# Patient Record
Sex: Female | Born: 1970
Health system: Southern US, Community
[De-identification: ages and names within clinical notes are randomized; demographics above are authoritative.]

## PROBLEM LIST (undated history)

## (undated) DIAGNOSIS — I10 Essential (primary) hypertension: Secondary | ICD-10-CM

## (undated) DIAGNOSIS — R7303 Prediabetes: Secondary | ICD-10-CM

## (undated) DIAGNOSIS — E785 Hyperlipidemia, unspecified: Secondary | ICD-10-CM

## (undated) DIAGNOSIS — M109 Gout, unspecified: Secondary | ICD-10-CM

## (undated) HISTORY — DX: Hyperlipidemia, unspecified: E78.5

## (undated) HISTORY — PX: BREAST SURGERY: SHX581

## (undated) HISTORY — DX: Prediabetes: R73.03

## (undated) HISTORY — DX: Essential (primary) hypertension: I10

## (undated) HISTORY — DX: Gout, unspecified: M10.9

---

## 1993-06-21 DIAGNOSIS — D649 Anemia, unspecified: Secondary | ICD-10-CM

## 1993-06-21 HISTORY — DX: Anemia, unspecified: D64.9

## 1998-05-27 ENCOUNTER — Encounter: Admission: RE | Admit: 1998-05-27 | Discharge: 1998-05-27 | Payer: Self-pay | Admitting: Internal Medicine

## 1998-10-09 ENCOUNTER — Encounter: Admission: RE | Admit: 1998-10-09 | Discharge: 1998-10-09 | Payer: Self-pay | Admitting: Hematology and Oncology

## 2004-10-14 ENCOUNTER — Other Ambulatory Visit: Admission: RE | Admit: 2004-10-14 | Discharge: 2004-10-14 | Payer: Self-pay | Admitting: Family Medicine

## 2005-02-15 ENCOUNTER — Emergency Department (HOSPITAL_COMMUNITY): Admission: EM | Admit: 2005-02-15 | Discharge: 2005-02-15 | Payer: Self-pay | Admitting: *Deleted

## 2006-07-08 ENCOUNTER — Other Ambulatory Visit: Admission: RE | Admit: 2006-07-08 | Discharge: 2006-07-08 | Payer: Self-pay | Admitting: Family Medicine

## 2009-06-13 ENCOUNTER — Emergency Department (HOSPITAL_COMMUNITY): Admission: EM | Admit: 2009-06-13 | Discharge: 2009-06-13 | Payer: Self-pay | Admitting: Emergency Medicine

## 2009-10-10 ENCOUNTER — Encounter: Admission: RE | Admit: 2009-10-10 | Discharge: 2009-10-10 | Payer: Self-pay | Admitting: Family Medicine

## 2010-10-26 ENCOUNTER — Other Ambulatory Visit: Payer: Self-pay | Admitting: Family Medicine

## 2010-10-26 ENCOUNTER — Other Ambulatory Visit (HOSPITAL_COMMUNITY)
Admission: RE | Admit: 2010-10-26 | Discharge: 2010-10-26 | Disposition: A | Discharge status: Home / Self Care | Payer: Managed Care, Other (non HMO) | Source: Ambulatory Visit | Attending: Family Medicine | Admitting: Family Medicine

## 2010-10-26 DIAGNOSIS — Z01419 Encounter for gynecological examination (general) (routine) without abnormal findings: Secondary | ICD-10-CM | POA: Insufficient documentation

## 2011-11-12 ENCOUNTER — Encounter (INDEPENDENT_AMBULATORY_CARE_PROVIDER_SITE_OTHER): Payer: Self-pay | Admitting: General Surgery

## 2011-11-16 ENCOUNTER — Encounter (INDEPENDENT_AMBULATORY_CARE_PROVIDER_SITE_OTHER): Payer: Self-pay | Admitting: General Surgery

## 2012-04-08 IMAGING — US US TRANSVAGINAL NON-OB
1 series · 13 of 25 positions shown · non-contrast
Comparison: None.

CLINICAL DATA: Menorrhagia and dysmenorrhea.  Question fibroids.
LMP 10/03/2009

TRANSABDOMINAL AND TRANSVAGINAL ULTRASOUND OF PELVIS
TECHNIQUE: Both transabdominal and transvaginal ultrasound
examinations of the pelvis were performed including evaluation of
the uterus, ovaries, adnexal regions, and pelvic cul-de-sac.

[Series 1: us transvaginal non-ob · 0.28mm/px · 13 of 54 slices shown]
[im 1/54]
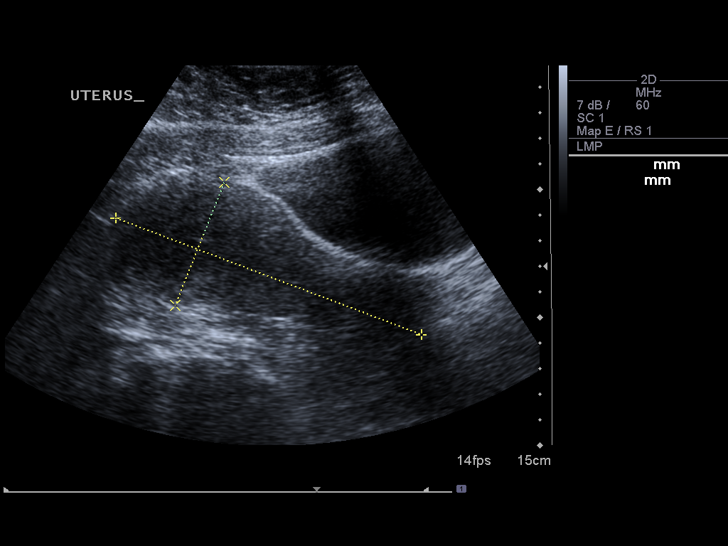
[im 5/54]
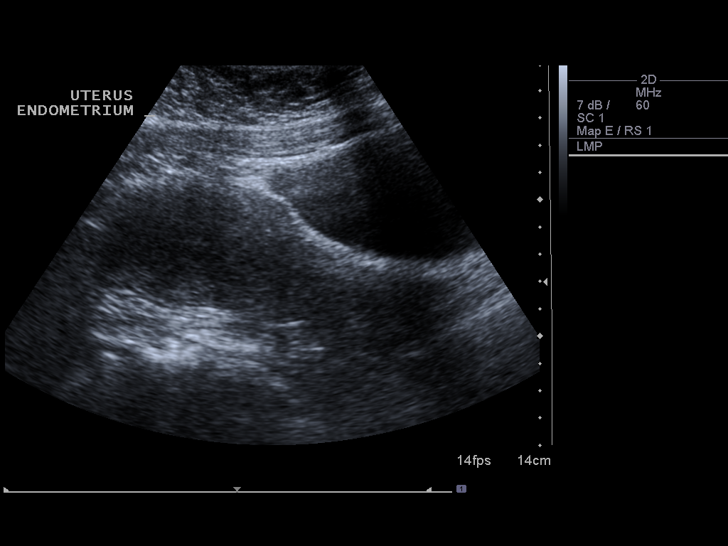
[im 9/54]
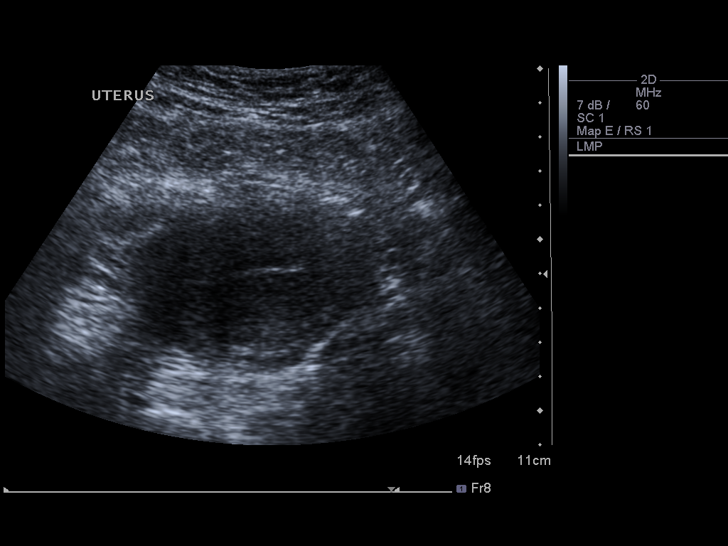
[im 14/54]
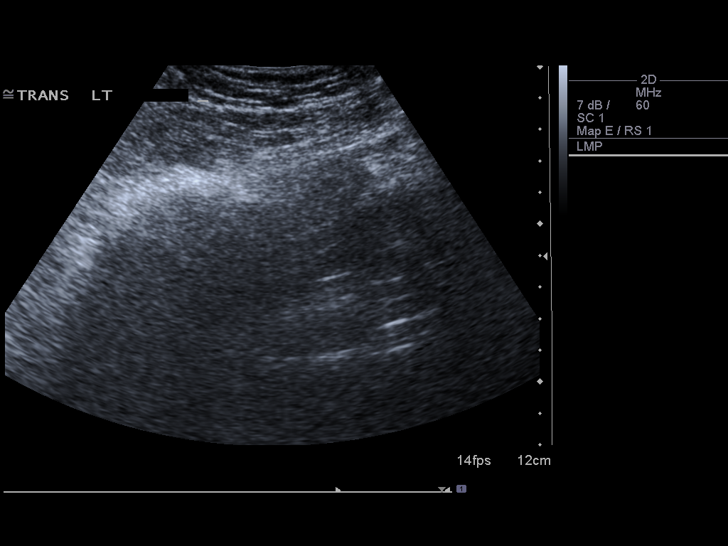
[im 18/54]
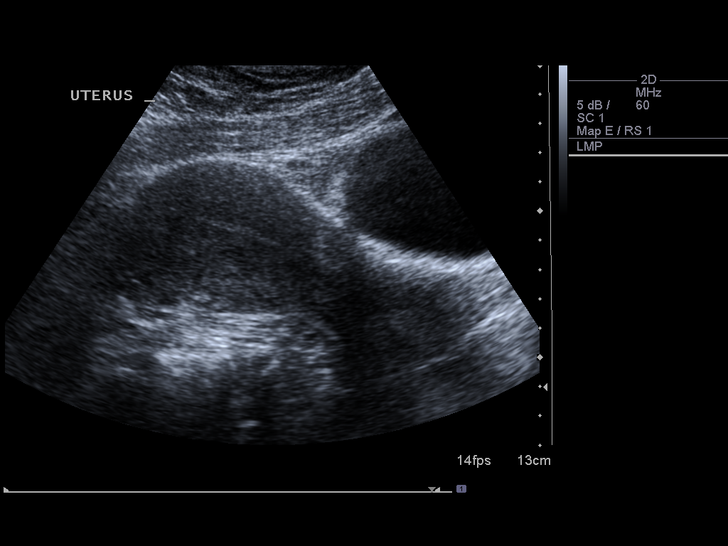
[im 23/54]
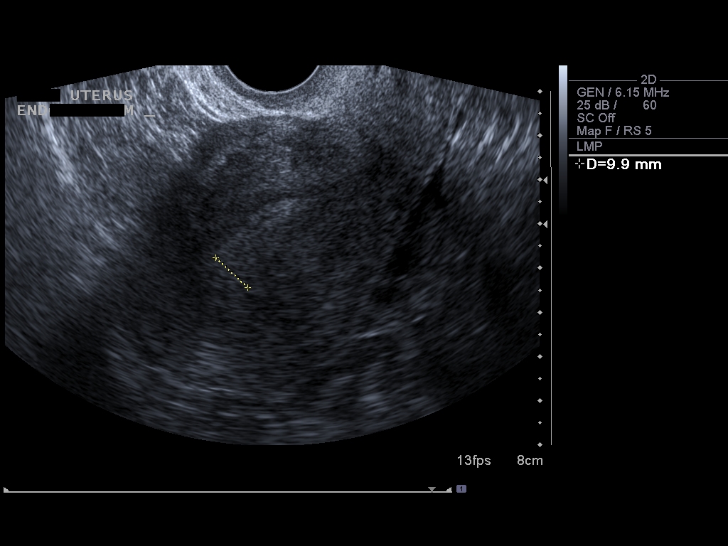
[im 27/54]
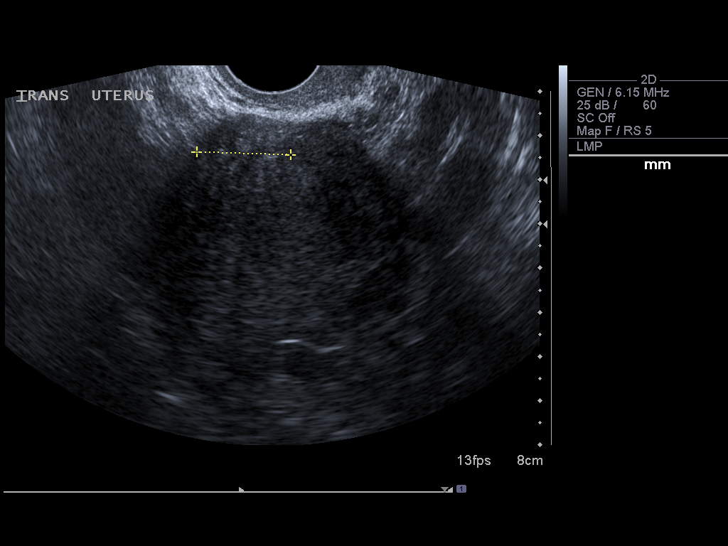
[im 31/54]
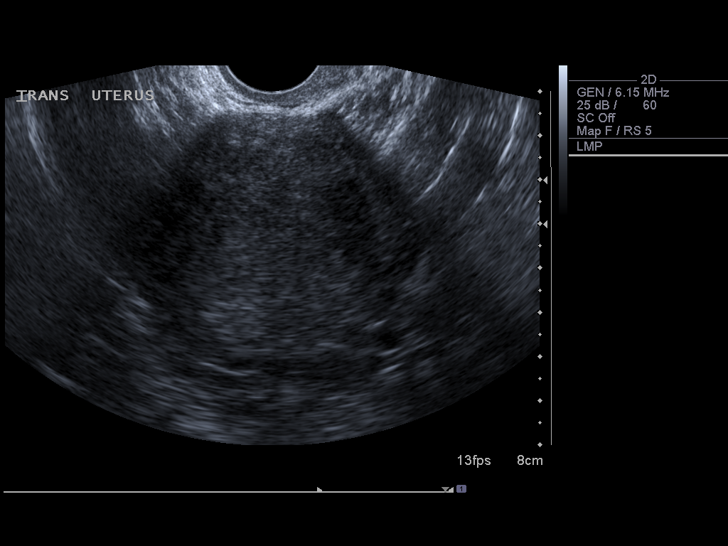
[im 36/54]
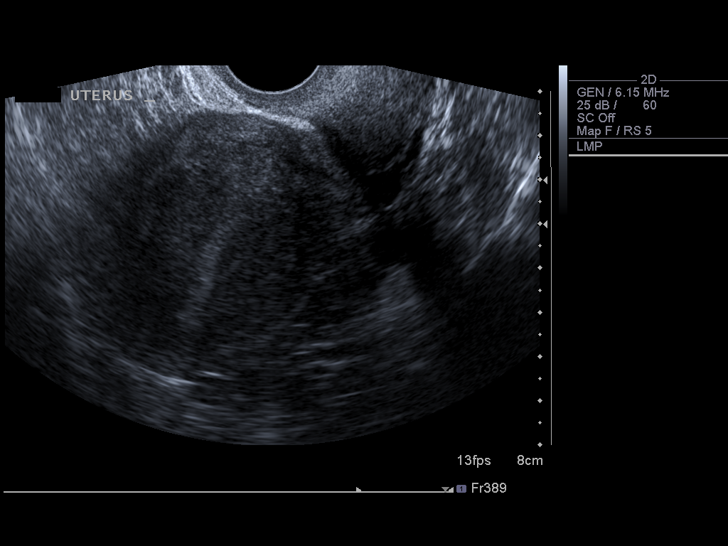
[im 40/54]
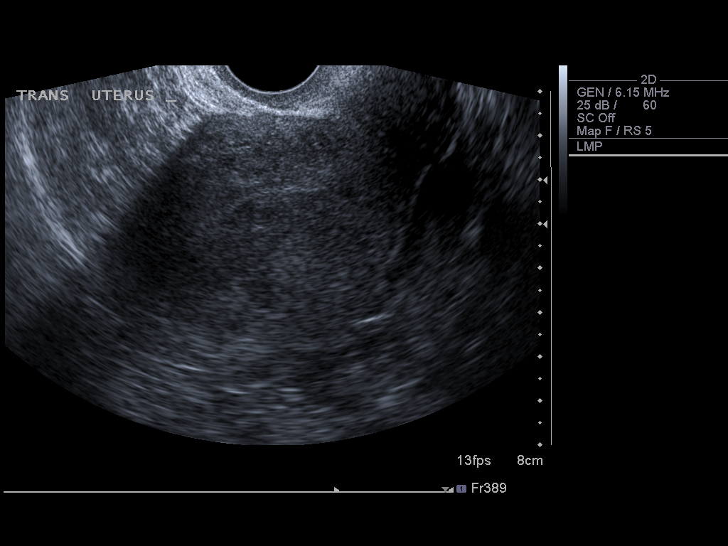
[im 45/54]
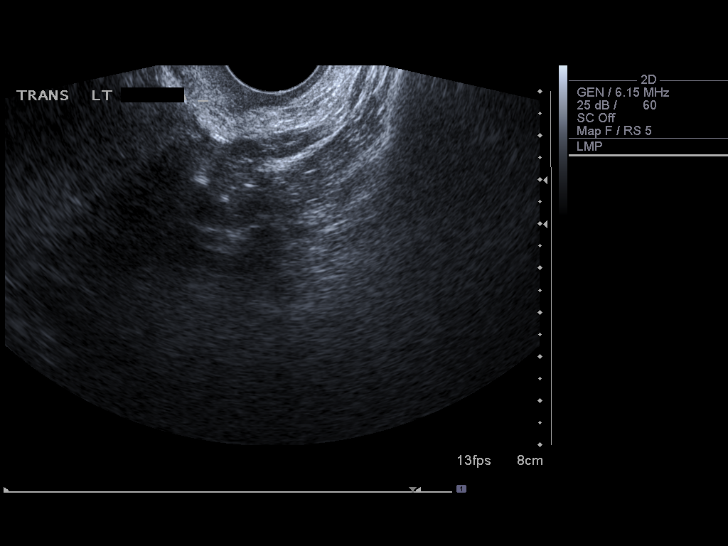
[im 49/54]
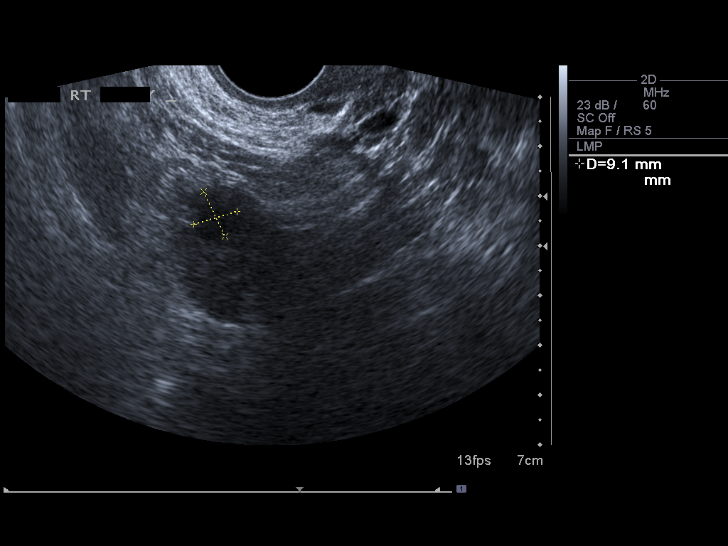
[im 54/54]
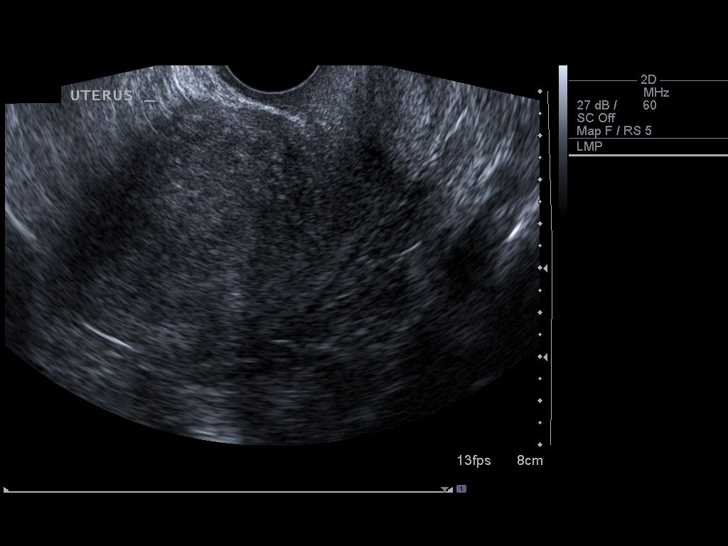

[13 of 25 positions shown; findings below may reference images not displayed]

FINDINGS: Uterus the uterus demonstrates a sagittal length of 12.2 cm, an AP
width of 5.2 cm and a transverse width of 5.7 cm.  The myometrium
appears inhomogeneous in nature with some posterior acoustical
shadowing from the anterior myometrium.  Two areas of focally
altered echotexture are seen compatible with small focal fibroids,
one which is mural in the left lateral upper uterine segment
measuring 1.2 x 1.3 x 1.0 cm and in the posterior midbody measuring
1.7 x 1.6 x 1.9 cm and with a questionable partial submucosal
component.  Overall scan resolution endovaginally is compromised by
the anterior-posterior positioning of the uterus.

Endometrium is homogeneously echogenic and demonstrates an AP width
of 5.3 mm this correlates with a postsecretory endometrial stripe
and the patient's given LMP of 10/03/2009.  No definite areas of
focal thickening or inhomogeneity are noted.

Right Ovary measures 2.8 x 2.2 x 2.0 cm and has a normal appearance

Left Ovary is not seen either transabdominally or endovaginally.

Other Findings:  No pelvic fluid is noted.
IMPRESSION: Inhomogeneous uterine myometrium especially notable anteriorly.
This finding raises the possibility of underlying adenomyosis.  Two
small measurable focal fibroids one of which may have a small
submucosal component.

No definite focal endometrial abnormality.  Normal right ovary and
non-visualized left ovary.

If clinical concern warrants, further assessment for the presence
of adenomyosis and better delineation of focal fibroids and the
relationship with the adjacent myometrium can be obtained width
MRI.  Sono hysterography may be less helpful given the positioning
of the uterus on endovaginal exam.

## 2012-12-20 ENCOUNTER — Emergency Department: Payer: Self-pay | Admitting: Emergency Medicine

## 2013-07-23 ENCOUNTER — Other Ambulatory Visit: Payer: Self-pay | Admitting: Family Medicine

## 2013-07-23 ENCOUNTER — Other Ambulatory Visit (HOSPITAL_COMMUNITY)
Admission: RE | Admit: 2013-07-23 | Discharge: 2013-07-23 | Disposition: A | Discharge status: Home / Self Care | Payer: Managed Care, Other (non HMO) | Source: Ambulatory Visit | Attending: Family Medicine | Admitting: Family Medicine

## 2013-07-23 DIAGNOSIS — Z1151 Encounter for screening for human papillomavirus (HPV): Secondary | ICD-10-CM | POA: Insufficient documentation

## 2013-07-23 DIAGNOSIS — Z01419 Encounter for gynecological examination (general) (routine) without abnormal findings: Secondary | ICD-10-CM | POA: Insufficient documentation

## 2019-09-22 ENCOUNTER — Ambulatory Visit: Payer: Managed Care, Other (non HMO) | Attending: Internal Medicine

## 2019-09-22 DIAGNOSIS — Z23 Encounter for immunization: Secondary | ICD-10-CM

## 2019-09-22 NOTE — Progress Notes (Signed)
   Covid-19 Vaccination Clinic  Name:  Jessica Schaefer    MRN: [id number] DOB: Feb 05, 1971  09/22/2019  Jessica Schaefer was observed post Covid-19 immunization for 15 minutes without incident. She was provided with Vaccine Information Sheet and instruction to access the V-Safe system.   Jessica Schaefer was instructed to call 911 with any severe reactions post vaccine: Marland Kitchen Difficulty breathing  . Swelling of face and throat  . A fast heartbeat  . A bad rash all over body  . Dizziness and weakness   Immunizations Administered    Name Date Dose VIS Date Route   Pfizer COVID-19 Vaccine 09/22/2019 11:22 AM 0.3 mL 06/01/2019 Intramuscular   Manufacturer: [employer], [employer]   Lot: [id number]   NDC: [address]-2

## 2019-10-16 ENCOUNTER — Ambulatory Visit: Payer: Managed Care, Other (non HMO)

## 2019-10-16 ENCOUNTER — Ambulatory Visit: Payer: Managed Care, Other (non HMO) | Attending: Internal Medicine

## 2019-10-16 DIAGNOSIS — Z23 Encounter for immunization: Secondary | ICD-10-CM

## 2019-10-16 NOTE — Progress Notes (Signed)
   Covid-19 Vaccination Clinic  Name:  YURIKA PEREDA    MRN: [id number] DOB: [birth year]  10/16/2019  Ms. Peine was observed post Covid-19 immunization for 15 minutes without incident. She was provided with Vaccine Information Sheet and instruction to access the V-Safe system.   Ms. Burson was instructed to call 911 with any severe reactions post vaccine: Marland Kitchen Difficulty breathing  . Swelling of face and throat  . A fast heartbeat  . A bad rash all over body  . Dizziness and weakness   Immunizations Administered    Name Date Dose VIS Date Route   Pfizer COVID-19 Vaccine 10/16/2019 12:36 PM 0.3 mL 08/15/2018 Intramuscular   Manufacturer: [employer], [employer]   Lot: [id number]   NDC: [address]-4

## 2020-03-21 ENCOUNTER — Ambulatory Visit (INDEPENDENT_AMBULATORY_CARE_PROVIDER_SITE_OTHER): Payer: Managed Care, Other (non HMO) | Admitting: Family Medicine

## 2020-03-21 ENCOUNTER — Other Ambulatory Visit: Payer: Self-pay

## 2020-03-21 ENCOUNTER — Other Ambulatory Visit: Payer: Self-pay | Admitting: Family Medicine

## 2020-03-21 ENCOUNTER — Encounter: Payer: Self-pay | Admitting: Family Medicine

## 2020-03-21 DIAGNOSIS — M109 Gout, unspecified: Secondary | ICD-10-CM

## 2020-03-21 DIAGNOSIS — Z09 Encounter for follow-up examination after completed treatment for conditions other than malignant neoplasm: Secondary | ICD-10-CM

## 2020-03-21 DIAGNOSIS — R8290 Unspecified abnormal findings in urine: Secondary | ICD-10-CM

## 2020-03-21 DIAGNOSIS — Z7689 Persons encountering health services in other specified circumstances: Secondary | ICD-10-CM

## 2020-03-21 DIAGNOSIS — R634 Abnormal weight loss: Secondary | ICD-10-CM

## 2020-03-21 DIAGNOSIS — Z23 Encounter for immunization: Secondary | ICD-10-CM | POA: Diagnosis not present

## 2020-03-21 DIAGNOSIS — I10 Essential (primary) hypertension: Secondary | ICD-10-CM | POA: Diagnosis not present

## 2020-03-21 DIAGNOSIS — E559 Vitamin D deficiency, unspecified: Secondary | ICD-10-CM

## 2020-03-21 HISTORY — DX: Vitamin D deficiency, unspecified: E55.9

## 2020-03-21 MED ORDER — COLCHICINE 0.6 MG PO TABS: ORAL_TABLET | ORAL | 3 refills | Status: DC

## 2020-03-21 MED ORDER — ALLOPURINOL 100 MG PO TABS: 100.0000 mg | ORAL_TABLET | Freq: Every day | ORAL | 3 refills | Status: DC

## 2020-03-21 MED ORDER — TETANUS-DIPHTHERIA TOXOIDS TD 5-2 LFU IM INJ: 0.5000 mL | INJECTION | Freq: Once | INTRAMUSCULAR | Status: DC

## 2020-03-21 MED ORDER — METFORMIN HCL 500 MG PO TABS
500.0000 mg | ORAL_TABLET | Freq: Two times a day (BID) | ORAL | 1 refills | Status: DC
Start: 2020-03-21 — End: 2020-09-24

## 2020-03-21 MED ORDER — HYDROCHLOROTHIAZIDE 25 MG PO TABS: 25.0000 mg | ORAL_TABLET | Freq: Every day | ORAL | 3 refills | Status: DC

## 2020-03-21 MED ORDER — LISINOPRIL 20 MG PO TABS: 20.0000 mg | ORAL_TABLET | Freq: Every day | ORAL | 3 refills | Status: DC

## 2020-03-21 NOTE — Progress Notes (Signed)
[facility]   New Patient--Establish Care  Subjective:  Patient ID: Jessica Schaefer, female    DOB: [birth year]  Age: 49 y.o. MRN: [id number]  CC:  Chief Complaint  Patient presents with  . Establish Care    Pt states she wants to get a check up. Pt states she have not seen a PCP since covid-19 started. Pt is also concerned about her weight gain.  . Knee Pain    Pt states when she walks it fills like her L knee is cracking off and on. X2 months    HPI Jessica Schaefer is a 49 year old female who presents to Establish Care today.    There are no problems to display for this patient.   Current Status: This will be Jessica Schaefer's initial office visit with me. She was previously seeing Dr. Wynelle Link for her PCP needs. Since her last office visit, she is doing well with no complaints. She has c/o of chronic bilateral knee pain. She currently works at home. She is also concerned about her recent weight gain. She has never been screened breast and colon cancer. She denies fevers, chills, fatigue, recent infections, weight loss, and night sweats. She has not had any headaches, visual changes, dizziness, and falls. No chest pain, heart palpitations, cough and shortness of breath reported. No reports of GI problems such as nausea, vomiting, diarrhea, and constipation. She has no reports of blood in stools, dysuria and hematuria. No depression or anxiety reported today. She is taking all medications as prescribed. She denies pain today.   Past Medical History:  Diagnosis Date  . Anemia 1995  . Gout   . Hypertension     Past Surgical History:  Procedure Laterality Date  . BREAST SURGERY     REDUCTION; 1997-1998    Family History  Problem Relation Age of Onset  . Early death Mother   . Hypertension Mother   . Obesity Mother     Social History   Socioeconomic History  . Marital status: Single    Spouse name: Not on file  . Number of  children: Not on file  . Years of education: Not on file  . Highest education level: Not on file  Occupational History  . Not on file  Tobacco Use  . Smoking status: Never Smoker  . Smokeless tobacco: Never Used  Vaping Use  . Vaping Use: Never assessed  Substance and Sexual Activity  . Alcohol use: No  . Drug use: No  . Sexual activity: Not Currently  Other Topics Concern  . Not on file  Social History Narrative  . Not on file   Social Determinants of Health   Financial Resource Strain:   . Difficulty of Paying Living Expenses: Not on file  Food Insecurity:   . Worried About Programme researcher, broadcasting/film/video in the Last Year: Not on file  . Ran Out of Food in the Last Year: Not on file  Transportation Needs:   . Lack of Transportation (Medical): Not on file  . Lack of Transportation (Non-Medical): Not on file  Physical Activity:   . Days of Exercise per Week: Not on file  . Minutes of Exercise per Session: Not on file  Stress:   . Feeling of Stress : Not on file  Social Connections:   . Frequency of Communication with Friends and Family: Not on file  . Frequency of Social Gatherings with Friends and Family: Not on  file  . Attends Religious Services: Not on file  . Active Member of Clubs or Organizations: Not on file  . Attends Banker Meetings: Not on file  . Marital Status: Not on file  Intimate Partner Violence:   . Fear of Current or Ex-Partner: Not on file  . Emotionally Abused: Not on file  . Physically Abused: Not on file  . Sexually Abused: Not on file    Outpatient Medications Prior to Visit  Medication Sig Dispense Refill  . hydrochlorothiazide (HYDRODIURIL) 25 MG tablet Take 25 mg by mouth daily.    . fluticasone (FLONASE) 50 MCG/ACT nasal spray Place 2 sprays into the nose daily. (Patient not taking: Reported on 03/21/2020)    . medroxyPROGESTERone (DEPO-PROVERA) 150 MG/ML injection Inject 150 mg into the muscle every 3 (three) months. (Patient not  taking: Reported on 03/21/2020)     No facility-administered medications prior to visit.    No Known Allergies  ROS Review of Systems  Constitutional: Negative.   HENT: Negative.   Eyes: Negative.   Respiratory: Negative.   Cardiovascular: Negative.   Gastrointestinal: Positive for abdominal distention (obese).  Endocrine: Negative.   Genitourinary: Negative.   Musculoskeletal: Positive for arthralgias (generalized joint pain).  Skin: Negative.   Allergic/Immunologic: Negative.   Neurological: Positive for dizziness (occasional ) and headaches (occasional ).  Hematological: Negative.   Psychiatric/Behavioral: Negative.       Objective:    Physical Exam Vitals and nursing note reviewed.  Constitutional:      Appearance: Normal appearance.  HENT:     Head: Normocephalic and atraumatic.     Nose: Nose normal.     Mouth/Throat:     Mouth: Mucous membranes are moist.     Pharynx: Oropharynx is clear.  Cardiovascular:     Rate and Rhythm: Normal rate and regular rhythm.     Pulses: Normal pulses.     Heart sounds: Normal heart sounds.  Pulmonary:     Effort: Pulmonary effort is normal.     Breath sounds: Normal breath sounds.  Abdominal:     General: Bowel sounds are normal. There is distension (obese).     Palpations: Abdomen is soft.  Musculoskeletal:        General: Normal range of motion.     Cervical back: Normal range of motion and neck supple.  Skin:    General: Skin is warm and dry.  Neurological:     General: No focal deficit present.     Mental Status: She is alert and oriented to person, place, and time.  Psychiatric:        Mood and Affect: Mood normal.        Behavior: Behavior normal.        Thought Content: Thought content normal.        Judgment: Judgment normal.     There were no vitals taken for this visit. Wt Readings from Last 3 Encounters:  No data found for Wt     Health Maintenance Due  Topic Date Due  . Hepatitis C Screening   Never done  . HIV Screening  Never done  . PAP SMEAR-Modifier  07/23/2016  . MAMMOGRAM  Never done  . COLONOSCOPY  Never done  . INFLUENZA VACCINE  Never done    There are no preventive care reminders to display for this patient.  Lab Results  Component Value Date   TSH 1.380 03/21/2020   Lab Results  Component Value Date  WBC 10.6 03/21/2020   HGB 13.2 03/21/2020   HCT 40.5 03/21/2020   MCV 87 03/21/2020   PLT 397 03/21/2020   Lab Results  Component Value Date   NA 143 03/21/2020   K 4.5 03/21/2020   CO2 19 (L) 03/21/2020   GLUCOSE 105 (H) 03/21/2020   BUN 12 03/21/2020   CREATININE 0.91 03/21/2020   BILITOT 0.2 03/21/2020   ALKPHOS 133 (H) 03/21/2020   AST 16 03/21/2020   ALT 20 03/21/2020   PROT 7.6 03/21/2020   ALBUMIN 4.1 03/21/2020   CALCIUM 9.5 03/21/2020   Lab Results  Component Value Date   CHOL 201 (H) 03/21/2020   Lab Results  Component Value Date   HDL 46 03/21/2020   Lab Results  Component Value Date   LDLCALC 131 (H) 03/21/2020   Lab Results  Component Value Date   TRIG 134 03/21/2020   Lab Results  Component Value Date   CHOLHDL 4.4 03/21/2020   No results found for: HGBA1C    Assessment & Plan:   1. Encounter to establish care - Urinalysis Dipstick - POC Glucose (CBG) - POC HgB A1c - Tdap vaccine greater than or equal to 7yo IM  2. Hypertension, unspecified type She will continue to take medications as prescribed, to decrease high sodium intake, excessive alcohol intake, increase potassium intake, smoking cessation, and increase physical activity of at least 30 minutes of cardio activity daily. She will continue to follow Heart Healthy or DASH diet - hydrochlorothiazide (HYDRODIURIL) 25 MG tablet; Take 1 tablet (25 mg total) by mouth daily.  Dispense: 90 tablet; Refill: 3 - lisinopril (ZESTRIL) 20 MG tablet; Take 1 tablet (20 mg total) by mouth daily.  Dispense: 90 tablet; Refill: 3 - CBC with Differential - Comprehensive  metabolic panel - TSH - Lipid Panel - Vitamin B12 - Vitamin D, 25-hydroxy  3. Gout, unspecified cause, unspecified chronicity, unspecified site - allopurinol (ZYLOPRIM) 100 MG tablet; Take 1 tablet (100 mg total) by mouth daily.  Dispense: 90 tablet; Refill: 3 - colchicine 0.6 MG tablet; Take 1 tablet 3 times a day on the fisrt day of flare up (Maximum daily dose).  Thereafter, take 1 tablet 2 times a day until gouty flare up resolves.  Dispense: 30 tablet; Refill: 3  4. Weight loss We will initiate Metformin today to aid in weight loss. Monitor.  - metFORMIN (GLUCOPHAGE) 500 MG tablet; Take 1 tablet (500 mg total) by mouth 2 (two) times daily with a meal.  Dispense: 60 tablet; Refill: 1  5. Abnormal urine finding  6. Follow up She will follow up in 1 month.   Meds ordered this encounter  Medications  . DISCONTD: tetanus & diphtheria toxoids (adult) (TENIVAC) injection 0.5 mL  . hydrochlorothiazide (HYDRODIURIL) 25 MG tablet    Sig: Take 1 tablet (25 mg total) by mouth daily.    Dispense:  90 tablet    Refill:  3  . lisinopril (ZESTRIL) 20 MG tablet    Sig: Take 1 tablet (20 mg total) by mouth daily.    Dispense:  90 tablet    Refill:  3  . allopurinol (ZYLOPRIM) 100 MG tablet    Sig: Take 1 tablet (100 mg total) by mouth daily.    Dispense:  90 tablet    Refill:  3  . DISCONTD: colchicine 0.6 MG tablet    Sig: Take 1 tablet 3 times a day on the fisrt day of flare up (Maximum daily dose).  Thereafter, take  1 tablet 2 times a day until gouty flare up resolves.    Dispense:  30 tablet    Refill:  3  . metFORMIN (GLUCOPHAGE) 500 MG tablet    Sig: Take 1 tablet (500 mg total) by mouth 2 (two) times daily with a meal.    Dispense:  60 tablet    Refill:  1  . colchicine 0.6 MG tablet    Sig: Take 1 tablet 3 times a day on the fisrt day of flare up (Maximum daily dose).  Thereafter, take 1 tablet 2 times a day until gouty flare up resolves.    Dispense:  30 tablet    Refill:   3    Orders Placed This Encounter  Procedures  . Tdap vaccine greater than or equal to 7yo IM  . CBC with Differential  . Comprehensive metabolic panel  . TSH  . Lipid Panel  . Vitamin B12  . Vitamin D, 25-hydroxy  . Urinalysis Dipstick  . POC Glucose (CBG)  . POC HgB A1c    Referral Orders  No referral(s) requested today    Raliegh Ip,  MSN, FNP-BC [facility] [address] [place], [place] [address] [phone] fax   Problem List Items Addressed This Visit    None    Visit Diagnoses    Encounter to establish care    -  Primary   Relevant Orders   Urinalysis Dipstick   POC Glucose (CBG)   POC HgB A1c   Tdap vaccine greater than or equal to 7yo IM (Completed)   Hypertension, unspecified type       Relevant Medications   hydrochlorothiazide (HYDRODIURIL) 25 MG tablet   lisinopril (ZESTRIL) 20 MG tablet   Other Relevant Orders   CBC with Differential (Completed)   Comprehensive metabolic panel (Completed)   TSH (Completed)   Lipid Panel (Completed)   Vitamin B12 (Completed)   Vitamin D, 25-hydroxy (Completed)   Gout, unspecified cause, unspecified chronicity, unspecified site       Relevant Medications   allopurinol (ZYLOPRIM) 100 MG tablet   colchicine 0.6 MG tablet   Weight loss       Relevant Medications   metFORMIN (GLUCOPHAGE) 500 MG tablet   Abnormal urine finding       Follow up          Meds ordered this encounter  Medications  . DISCONTD: tetanus & diphtheria toxoids (adult) (TENIVAC) injection 0.5 mL  . hydrochlorothiazide (HYDRODIURIL) 25 MG tablet    Sig: Take 1 tablet (25 mg total) by mouth daily.    Dispense:  90 tablet    Refill:  3  . lisinopril (ZESTRIL) 20 MG tablet    Sig: Take 1 tablet (20 mg total) by mouth daily.    Dispense:  90 tablet    Refill:  3  . allopurinol (ZYLOPRIM) 100 MG tablet    Sig: Take 1 tablet (100 mg  total) by mouth daily.    Dispense:  90 tablet    Refill:  3  . DISCONTD: colchicine 0.6 MG tablet    Sig: Take 1 tablet 3 times a day on the fisrt day of flare up (Maximum daily dose).  Thereafter, take 1 tablet 2 times a day until gouty flare up resolves.    Dispense:  30 tablet    Refill:  3  . metFORMIN (GLUCOPHAGE) 500 MG tablet    Sig: Take 1  tablet (500 mg total) by mouth 2 (two) times daily with a meal.    Dispense:  60 tablet    Refill:  1  . colchicine 0.6 MG tablet    Sig: Take 1 tablet 3 times a day on the fisrt day of flare up (Maximum daily dose).  Thereafter, take 1 tablet 2 times a day until gouty flare up resolves.    Dispense:  30 tablet    Refill:  3    Follow-up: No follow-ups on file.    Kallie LocksNatalie M Lanina Larranaga, FNP

## 2020-03-21 NOTE — Patient Instructions (Addendum)
Gout  Gout is painful swelling of your joints. Gout is a type of arthritis. It is caused by having too much uric acid in your body. Uric acid is a chemical that is made when your body breaks down substances called purines. If your body has too much uric acid, sharp crystals can form and build up in your joints. This causes pain and swelling. Gout attacks can happen quickly and be very painful (acute gout). Over time, the attacks can affect more joints and happen more often (chronic gout). What are the causes?  Too much uric acid in your blood. This can happen because: ? Your kidneys do not remove enough uric acid from your blood. ? Your body makes too much uric acid. ? You eat too many foods that are high in purines. These foods include organ meats, some seafood, and beer.  Trauma or stress. What increases the risk?  Having a family history of gout.  Being female and middle-aged.  Being female and having gone through menopause.  Being very overweight (obese).  Drinking alcohol, especially beer.  Not having enough water in the body (being dehydrated).  Losing weight too quickly.  Having an organ transplant.  Having lead poisoning.  Taking certain medicines.  Having kidney disease.  Having a skin condition called psoriasis. What are the signs or symptoms? An attack of acute gout usually happens in just one joint. The most common place is the big toe. Attacks often start at night. Other joints that may be affected include joints of the feet, ankle, knee, fingers, wrist, or elbow. Symptoms of an attack may include:  Very bad pain.  Warmth.  Swelling.  Stiffness.  Shiny, red, or purple skin.  Tenderness. The affected joint may be very painful to touch.  Chills and fever. Chronic gout may cause symptoms more often. More joints may be involved. You may also have white or yellow lumps (tophi) on your hands or feet or in other areas near your joints. How is this treated?   Treatment for this condition has two phases: treating an acute attack and preventing future attacks.  Acute gout treatment may include: ? NSAIDs. ? Steroids. These are taken by mouth or injected into a joint. ? Colchicine. This medicine relieves pain and swelling. It can be given by mouth or through an IV tube.  Preventive treatment may include: ? Taking small doses of NSAIDs or colchicine daily. ? Using a medicine that reduces uric acid levels in your blood. ? Making changes to your diet. You may need to see a food expert (dietitian) about what to eat and drink to prevent gout. Follow these instructions at home: During a gout attack   If told, put ice on the painful area: ? Put ice in a plastic bag. ? Place a towel between your skin and the bag. ? Leave the ice on for 20 minutes, 2-3 times a day.  Raise (elevate) the painful joint above the level of your heart as often as you can.  Rest the joint as much as possible. If the joint is in your leg, you may be given crutches.  Follow instructions from your doctor about what you cannot eat or drink. Avoiding future gout attacks  Eat a low-purine diet. Avoid foods and drinks such as: ? Liver. ? Kidney. ? Anchovies. ? Asparagus. ? Herring. ? Mushrooms. ? Mussels. ? Beer.  Stay at a healthy weight. If you want to lose weight, talk with your doctor. Do not lose weight  too fast.  Start or continue an exercise plan as told by your doctor. Eating and drinking  Drink enough fluids to keep your pee (urine) pale yellow.  If you drink alcohol: ? Limit how much you use to:  0-1 drink a day for women.  0-2 drinks a day for men. ? Be aware of how much alcohol is in your drink. In the [place] one drink equals one 12 oz bottle of beer (355 mL), one 5 oz glass of wine (148 mL), or one 1 oz glass of hard liquor (44 mL). General instructions  Take over-the-counter and prescription medicines only as told by your doctor.  Do not drive or  use heavy machinery while taking prescription pain medicine.  Return to your normal activities as told by your doctor. Ask your doctor what activities are safe for you.  Keep all follow-up visits as told by your doctor. This is important. Contact a doctor if:  You have another gout attack.  You still have symptoms of a gout attack after 10 days of treatment.  You have problems (side effects) because of your medicines.  You have chills or a fever.  You have burning pain when you pee (urinate).  You have pain in your lower back or belly. Get help right away if:  You have very bad pain.  Your pain cannot be controlled.  You cannot pee. Summary  Gout is painful swelling of the joints.  The most common site of pain is the big toe, but it can affect other joints.  Medicines and avoiding some foods can help to prevent and treat gout attacks. This information is not intended to replace advice given to you by your health care provider. Make sure you discuss any questions you have with your health care provider. Document Revised: 12/28/2017 Document Reviewed: 12/28/2017 Elsevier Patient Education  [place]. Allopurinol tablets What is this medicine? ALLOPURINOL (al oh PURE i nole) reduces the amount of uric acid the body makes. It is used to treat the symptoms of gout. It is also used to treat or prevent high uric acid levels that occur as a result of certain types of chemotherapy. This medicine may also help patients who frequently have kidney stones. This medicine may be used for other purposes; ask your health care provider or pharmacist if you have questions. COMMON BRAND NAME(S): Zyloprim What should I tell my health care provider before I take this medicine? They need to know if you have any of these conditions:  kidney disease  liver disease  an unusual or allergic reaction to allopurinol, other medicines, foods, dyes, or preservatives  pregnant or trying to  get pregnant  breast feeding How should I use this medicine? Take this medicine by mouth with a glass of water. Follow the directions on the prescription label. If this medicine upsets your stomach, take it with food or milk. Take your doses at regular intervals. Do not take your medicine more often than directed. Talk to your pediatrician regarding the use of this medicine in children. Special care may be needed. While this drug may be prescribed for children as young as 6 years for selected conditions, precautions do apply. Overdosage: If you think you have taken too much of this medicine contact a poison control center or emergency room at once. NOTE: This medicine is only for you. Do not share this medicine with others. What if I miss a dose? If you miss a dose, take it as soon as  you can. If it is almost time for your next dose, take only that dose. Do not take double or extra doses. What may interact with this medicine? Do not take this medicine with the following medication:  didanosine, ddI This medicine may also interact with the following medications:  certain antibiotics like amoxicillin, ampicillin  certain medicines for cancer  certain medicines for immunosuppression like azathioprine, cyclosporine, mercaptopurine  chlorpropamide  probenecid  thiazide diuretics, like hydrochlorothiazide  sulfinpyrazone  warfarin This list may not describe all possible interactions. Give your health care provider a list of all the medicines, herbs, non-prescription drugs, or dietary supplements you use. Also tell them if you smoke, drink alcohol, or use illegal drugs. Some items may interact with your medicine. What should I watch for while using this medicine? Visit your doctor or healthcare provider for regular checks on your progress. If you are taking this medicine to treat gout, you may not have less frequent attacks at first. Keep taking your medicine regularly and the attacks  should get better within 2 to 6 weeks. Drink plenty of water (10 to 12 full glasses a day) while you are taking this medicine. This will help to reduce stomach upset and reduce the risk of getting gout or kidney stones. Call your doctor or healthcare provider at once if you get a skin rash together with chills, fever, sore throat, or nausea and vomiting, if you have blood in your urine, or difficulty passing urine. This medicine may cause serious skin reactions. They can happen weeks to months after starting the medicine. Contact your healthcare provider right away if you notice fevers or flu-like symptoms with a rash. The rash may be red or purple and then turn into blisters or peeling of the skin. Or, you might notice a red rash with swelling of the face, lips or lymph nodes in your neck or under your arms. Do not take vitamin C without asking your doctor or healthcare provider. Too much vitamin C can increase the chance of getting kidney stones. You may get drowsy or dizzy. Do not drive, use machinery, or do anything that needs mental alertness until you know how this drug affects you. Do not stand or sit up quickly, especially if you are an older patient. This reduces the risk of dizzy or fainting spells. Alcohol can make you more drowsy and dizzy. Alcohol can also increase the chance of stomach problems and increase the amount of uric acid in your blood. Avoid alcoholic drinks. What side effects may I notice from receiving this medicine? Side effects that you should report to your doctor or health care professional as soon as possible:  allergic reactions like skin rash, itching or hives, swelling of the face, lips, or tongue  breathing problems  joint pain  muscle pain  rash, fever, and swollen lymph nodes  redness, blistering, peeling, or loosening of the skin, including inside the mouth  signs and symptoms of infection like fever or chills; cough; sore throat  signs and symptoms of  kidney injury like trouble passing urine or change in the amount of urine, flank pain  tingling, numbness in the hands or feet  unusual bleeding or bruising  unusually weak or tired Side effects that usually do not require medical attention (report to your doctor or health care professional if they continue or are bothersome):  changes in taste  diarrhea  drowsiness  headache  nausea, vomiting  stomach upset This list may not describe all possible side  effects. Call your doctor for medical advice about side effects. You may report side effects to FDA at 1-800-FDA-1088. Where should I keep my medicine? Keep out of the reach of children. Store at room temperature between 15 and 25 degrees C (59 and 77 degrees F). Protect from light and moisture. Throw away any unused medicine after the expiration date. NOTE: This sheet is a summary. It may not cover all possible information. If you have questions about this medicine, talk to your doctor, pharmacist, or health care provider.  2020 Elsevier/Gold Standard (2018-08-29 09:41:46) Colchicine; Probenecid oral tablet What is this medicine? COLCHICINE; PROBENECID (KOL chi seen; proe BEN e sid) is for joint pain and swelling due to gouty arthritis. This medicine may be used for other purposes; ask your health care provider or pharmacist if you have questions. What should I tell my health care provider before I take this medicine? They need to know if you have any of these conditions:  anemia  blood disorders like leukemia or lymphoma  having an acute gouty attack  heart disease  immune system problems  intestinal disease  kidney disease, stones  liver disease  low platelet counts  stomach problems  an unusual or allergic reaction to colchicine, probenecid, other medicines, lactose, foods, dyes, or preservatives  pregnant or trying to get pregnant  breast-feeding How should I use this medicine? Take this medicine by  mouth with a full glass of water. Follow the directions on the prescription label. Take your medicine at regular intervals. Do not take your medicine more often than directed. Do not stop taking except on your doctor's advice. Talk to your pediatrician regarding the use of this medicine in children. Special care may be needed. Overdosage: If you think you have taken too much of this medicine contact a poison control center or emergency room at once. NOTE: This medicine is only for you. Do not share this medicine with others. What if I miss a dose? If you miss a dose, take it as soon as you can. If it is almost time for your next dose, take only that dose. Do not take double or extra doses. What may interact with this medicine? Do not take this medicine with any of the following medications:  aspirin and aspirin-like drugs  certain medicines for fungal infections like itraconazole or ketoconazole  clarithromycin  erythromycin  ketorolac  methotrexate  topiramate This medicine may also interact with the following medications:  acetaminophen  alcohol  antibiotics including penicillins, sulfonamides  antiviral medicines such as acyclovir, famciclovir, ganciclovir  cyclosporine  epinephrine  lorazepam  meclofenamate  medicines for diabetes  medicines for sleep during surgery  methenamine  methotrexate  NSAIDs, medicines for pain and inflammation, like ibuprofen or naproxen  pyrazinamide  rifampin  sodium bicarbonate This list may not describe all possible interactions. Give your health care provider a list of all the medicines, herbs, non-prescription drugs, or dietary supplements you use. Also tell them if you smoke, drink alcohol, or use illegal drugs. Some items may interact with your medicine. What should I watch for while using this medicine? Visit your doctor or health care professional for regular checks on your progress. You may need periodic blood checks.  Aspirin and non-steroidal antiinflammatory drugs like ibuprofen can make this medicine less effective. Do not treat yourself for headaches or pain. Ask your doctor or health care professional for advice. Alcohol can increase the chance of getting stomach problems and gout attacks. Do not drink alcohol. You  may need to be on a special diet while taking this medicine. Check with your doctor. Also, ask how many glasses of fluid you need to drink a day. You must not get dehydrated. This medicine may cause a decrease in vitamin B12. You should make sure that you get enough vitamin B12 while you are taking this medicine. Discuss the foods you eat and the vitamins you take with your health care professional. This medicine can interfere with some urine glucose tests. If you use such tests talk with your health care professional. What side effects may I notice from receiving this medicine? Side effects that you should report to your doctor or health care professional as soon as possible:  allergic reactions like skin rash, itching or hives, swelling of the face, lips, or tongue  back or kidney pain  breathing problems  dark urine  fever, chills, or sore throat  numbness or tingling in hands or feet  trouble passing urine or change in the amount of urine  unusual bleeding or bruising  unusually weak or tired Side effects that usually do not require medical attention (report to your doctor or health care professional if they continue or are bothersome):  diarrhea  hair loss  headache  loss of appetite  nausea, vomiting  stomach upset This list may not describe all possible side effects. Call your doctor for medical advice about side effects. You may report side effects to FDA at 1-800-FDA-1088. Where should I keep my medicine? Keep out of the reach of children. Store at room temperature between 20 and 25 degrees C (68 and 77 degrees F). Keep container tightly closed. Throw away any  unused medicine after the expiration date. NOTE: This sheet is a summary. It may not cover all possible information. If you have questions about this medicine, talk to your doctor, pharmacist, or health care provider.  2020 Elsevier/Gold Standard (2017-01-14 11:02:55) Metformin extended-release tablets What is this medicine? METFORMIN (met FOR min) is used to treat type 2 diabetes. It helps to control blood sugar. Treatment is combined with diet and exercise. This medicine can be used alone or with other medicines for diabetes. This medicine may be used for other purposes; ask your health care provider or pharmacist if you have questions. COMMON BRAND NAME(S): Fortamet, Glucophage XR, Glumetza What should I tell my health care provider before I take this medicine? They need to know if you have any of these conditions:  anemia  dehydration  heart disease  frequently drink alcohol-containing beverages  kidney disease  liver disease  polycystic ovary syndrome  serious infection or injury  vomiting  an unusual or allergic reaction to metformin, other medicines, foods, dyes, or preservatives  pregnant or trying to get pregnant  breast-feeding How should I use this medicine? Take this medicine by mouth with a glass of water. Follow the directions on the prescription label. Take this medicine with food. Take your medicine at regular intervals. Do not take your medicine more often than directed. Do not stop taking except on your doctor's advice. Talk to your pediatrician regarding the use of this medicine in children. Special care may be needed. Overdosage: If you think you have taken too much of this medicine contact a poison control center or emergency room at once. NOTE: This medicine is only for you. Do not share this medicine with others. What if I miss a dose? If you miss a dose, take it as soon as you can. If it is almost time  for your next dose, take only that dose. Do not  take double or extra doses. What may interact with this medicine? Do not take this medicine with any of the following medications:  certain contrast medicines given before X-rays, CT scans, MRI, or other procedures  dofetilide This medicine may also interact with the following medications:  acetazolamide  alcohol  certain antivirals for HIV or hepatitis  certain medicines for blood pressure, heart disease, irregular heart beat  cimetidine  dichlorphenamide  digoxin  diuretics  female hormones, like estrogens or progestins and birth control pills  glycopyrrolate  isoniazid  lamotrigine  memantine  methazolamide  metoclopramide  midodrine  niacin  phenothiazines like chlorpromazine, mesoridazine, prochlorperazine, thioridazine  phenytoin  ranolazine  steroid medicines like prednisone or cortisone  stimulant medicines for attention disorders, weight loss, or to stay awake  thyroid medicines  topiramate  trospium  vandetanib  zonisamide This list may not describe all possible interactions. Give your health care provider a list of all the medicines, herbs, non-prescription drugs, or dietary supplements you use. Also tell them if you smoke, drink alcohol, or use illegal drugs. Some items may interact with your medicine. What should I watch for while using this medicine? Visit your doctor or health care professional for regular checks on your progress. A test called the HbA1C (A1C) will be monitored. This is a simple blood test. It measures your blood sugar control over the last 2 to 3 months. You will receive this test every 3 to 6 months. Learn how to check your blood sugar. Learn the symptoms of low and high blood sugar and how to manage them. Always carry a quick-source of sugar with you in case you have symptoms of low blood sugar. Examples include hard sugar candy or glucose tablets. Make sure others know that you can choke if you eat or drink when  you develop serious symptoms of low blood sugar, such as seizures or unconsciousness. They must get medical help at once. Tell your doctor or health care professional if you have high blood sugar. You might need to change the dose of your medicine. If you are sick or exercising more than usual, you might need to change the dose of your medicine. Do not skip meals. Ask your doctor or health care professional if you should avoid alcohol. Many nonprescription cough and cold products contain sugar or alcohol. These can affect blood sugar. This medicine may cause ovulation in premenopausal women who do not have regular monthly periods. This may increase your chances of becoming pregnant. You should not take this medicine if you become pregnant or think you may be pregnant. Talk with your doctor or health care professional about your birth control options while taking this medicine. Contact your doctor or health care professional right away if you think you are pregnant. The tablet shell for some brands of this medicine does not dissolve. This is normal. The tablet shell may appear whole in the stool. This is not a cause for concern. If you are going to need surgery, a MRI, CT scan, or other procedure, tell your doctor that you are taking this medicine. You may need to stop taking this medicine before the procedure. Wear a medical ID bracelet or chain, and carry a card that describes your disease and details of your medicine and dosage times. This medicine may cause a decrease in folic acid and vitamin B12. You should make sure that you get enough vitamins while you are taking this  medicine. Discuss the foods you eat and the vitamins you take with your health care professional. What side effects may I notice from receiving this medicine? Side effects that you should report to your doctor or health care professional as soon as possible:  allergic reactions like skin rash, itching or hives, swelling of the face,  lips, or tongue  breathing problems  feeling faint or lightheaded, falls  muscle aches or pains  signs and symptoms of low blood sugar such as feeling anxious, confusion, dizziness, increased hunger, unusually weak or tired, sweating, shakiness, cold, irritable, headache, blurred vision, fast heartbeat, loss of consciousness  slow or irregular heartbeat  unusual stomach pain or discomfort  unusually tired or weak Side effects that usually do not require medical attention (report to your doctor or health care professional if they continue or are bothersome):  diarrhea  headache  heartburn  metallic taste in mouth  nausea  stomach gas, upset This list may not describe all possible side effects. Call your doctor for medical advice about side effects. You may report side effects to FDA at 1-800-FDA-1088. Where should I keep my medicine? Keep out of the reach of children. Store at room temperature between 15 and 30 degrees C (59 and 86 degrees F). Protect from light. Throw away any unused medicine after the expiration date. NOTE: This sheet is a summary. It may not cover all possible information. If you have questions about this medicine, talk to your doctor, pharmacist, or health care provider.  2020 Elsevier/Gold Standard (2017-07-14 18:58:32) DASH Eating Plan DASH stands for "Dietary Approaches to Stop Hypertension." The DASH eating plan is a healthy eating plan that has been shown to reduce high blood pressure (hypertension). It may also reduce your risk for type 2 diabetes, heart disease, and stroke. The DASH eating plan may also help with weight loss. What are tips for following this plan?  General guidelines  Avoid eating more than 2,300 mg (milligrams) of salt (sodium) a day. If you have hypertension, you may need to reduce your sodium intake to 1,500 mg a day.  Limit alcohol intake to no more than 1 drink a day for nonpregnant women and 2 drinks a day for men. One drink  equals 12 oz of beer, 5 oz of wine, or 1 oz of hard liquor.  Work with your health care provider to maintain a healthy body weight or to lose weight. Ask what an ideal weight is for you.  Get at least 30 minutes of exercise that causes your heart to beat faster (aerobic exercise) most days of the week. Activities may include walking, swimming, or biking.  Work with your health care provider or diet and nutrition specialist (dietitian) to adjust your eating plan to your individual calorie needs. Reading food labels   Check food labels for the amount of sodium per serving. Choose foods with less than 5 percent of the Daily Value of sodium. Generally, foods with less than 300 mg of sodium per serving fit into this eating plan.  To find whole grains, look for the word "whole" as the first word in the ingredient list. Shopping  Buy products labeled as "low-sodium" or "no salt added."  Buy fresh foods. Avoid canned foods and premade or frozen meals. Cooking  Avoid adding salt when cooking. Use salt-free seasonings or herbs instead of table salt or sea salt. Check with your health care provider or pharmacist before using salt substitutes.  Do not fry foods. Cook foods using healthy  methods such as baking, boiling, grilling, and broiling instead.  Cook with heart-healthy oils, such as olive, canola, soybean, or sunflower oil. Meal planning  Eat a balanced diet that includes: ? 5 or more servings of fruits and vegetables each day. At each meal, try to fill half of your plate with fruits and vegetables. ? Up to 6-8 servings of whole grains each day. ? Less than 6 oz of lean meat, poultry, or fish each day. A 3-oz serving of meat is about the same size as a deck of cards. One egg equals 1 oz. ? 2 servings of low-fat dairy each day. ? A serving of nuts, seeds, or beans 5 times each week. ? Heart-healthy fats. Healthy fats called Omega-3 fatty acids are found in foods such as flaxseeds and  coldwater fish, like sardines, salmon, and mackerel.  Limit how much you eat of the following: ? Canned or prepackaged foods. ? Food that is high in trans fat, such as fried foods. ? Food that is high in saturated fat, such as fatty meat. ? Sweets, desserts, sugary drinks, and other foods with added sugar. ? Full-fat dairy products.  Do not salt foods before eating.  Try to eat at least 2 vegetarian meals each week.  Eat more home-cooked food and less restaurant, buffet, and fast food.  When eating at a restaurant, ask that your food be prepared with less salt or no salt, if possible. What foods are recommended? The items listed may not be a complete list. Talk with your dietitian about what dietary choices are best for you. Grains Whole-grain or whole-wheat bread. Whole-grain or whole-wheat pasta. Brown rice. [place]. Bulgur. Whole-grain and low-sodium cereals. Pita bread. Low-fat, low-sodium crackers. Whole-wheat flour tortillas. Vegetables Fresh or frozen vegetables (raw, steamed, roasted, or grilled). Low-sodium or reduced-sodium tomato and vegetable juice. Low-sodium or reduced-sodium tomato sauce and tomato paste. Low-sodium or reduced-sodium canned vegetables. Fruits All fresh, dried, or frozen fruit. Canned fruit in natural juice (without added sugar). Meat and other protein foods Skinless chicken or [place]. Ground chicken or [place]. Pork with fat trimmed off. Fish and seafood. Egg whites. Dried beans, peas, or lentils. Unsalted nuts, nut butters, and seeds. Unsalted canned beans. Lean cuts of beef with fat trimmed off. Low-sodium, lean deli meat. Dairy Low-fat (1%) or fat-free (skim) milk. Fat-free, low-fat, or reduced-fat cheeses. Nonfat, low-sodium ricotta or cottage cheese. Low-fat or nonfat yogurt. Low-fat, low-sodium cheese. Fats and oils Soft margarine without trans fats. Vegetable oil. Low-fat, reduced-fat, or light mayonnaise and salad dressings (reduced-sodium).  Canola, safflower, olive, soybean, and sunflower oils. Avocado. Seasoning and other foods Herbs. Spices. Seasoning mixes without salt. Unsalted popcorn and pretzels. Fat-free sweets. What foods are not recommended? The items listed may not be a complete list. Talk with your dietitian about what dietary choices are best for you. Grains Baked goods made with fat, such as croissants, muffins, or some breads. Dry pasta or rice meal packs. Vegetables Creamed or fried vegetables. Vegetables in a cheese sauce. Regular canned vegetables (not low-sodium or reduced-sodium). Regular canned tomato sauce and paste (not low-sodium or reduced-sodium). Regular tomato and vegetable juice (not low-sodium or reduced-sodium). Angie Fava. Olives. Fruits Canned fruit in a light or heavy syrup. Fried fruit. Fruit in cream or butter sauce. Meat and other protein foods Fatty cuts of meat. Ribs. Fried meat. Berniece Salines. Sausage. Bologna and other processed lunch meats. Salami. Fatback. Hotdogs. Bratwurst. Salted nuts and seeds. Canned beans with added salt. Canned or smoked fish. Whole eggs  or egg yolks. Chicken or [place] with skin. Dairy Whole or 2% milk, cream, and half-and-half. Whole or full-fat cream cheese. Whole-fat or sweetened yogurt. Full-fat cheese. Nondairy creamers. Whipped toppings. Processed cheese and cheese spreads. Fats and oils Butter. Stick margarine. Lard. Shortening. Ghee. Bacon fat. Tropical oils, such as coconut, palm kernel, or palm oil. Seasoning and other foods Salted popcorn and pretzels. Onion salt, garlic salt, seasoned salt, table salt, and sea salt. Worcestershire sauce. Tartar sauce. Barbecue sauce. Teriyaki sauce. Soy sauce, including reduced-sodium. Steak sauce. Canned and packaged gravies. Fish sauce. Oyster sauce. Cocktail sauce. Horseradish that you find on the shelf. Ketchup. Mustard. Meat flavorings and tenderizers. Bouillon cubes. Hot sauce and Tabasco sauce. Premade or packaged marinades.  Premade or packaged taco seasonings. Relishes. Regular salad dressings. Where to find more information:  National Heart, Lung, and [place]: https://wilson-eaton.com/  American Heart Association: www.heart.org Summary  The DASH eating plan is a healthy eating plan that has been shown to reduce high blood pressure (hypertension). It may also reduce your risk for type 2 diabetes, heart disease, and stroke.  With the DASH eating plan, you should limit salt (sodium) intake to 2,300 mg a day. If you have hypertension, you may need to reduce your sodium intake to 1,500 mg a day.  When on the DASH eating plan, aim to eat more fresh fruits and vegetables, whole grains, lean proteins, low-fat dairy, and heart-healthy fats.  Work with your health care provider or diet and nutrition specialist (dietitian) to adjust your eating plan to your individual calorie needs. This information is not intended to replace advice given to you by your health care provider. Make sure you discuss any questions you have with your health care provider. Document Revised: 05/20/2017 Document Reviewed: 05/31/2016 Elsevier Patient Education  2020 [employer].

## 2020-03-22 LAB — CBC WITH DIFFERENTIAL/PLATELET
Basophils Absolute: 0.1 10*3/uL (ref 0.0–0.2)
Basos: 1 %
EOS (ABSOLUTE): 0.4 10*3/uL (ref 0.0–0.4)
Eos: 4 %
Hematocrit: 40.5 % (ref 34.0–46.6)
Hemoglobin: 13.2 g/dL (ref 11.1–15.9)
Immature Grans (Abs): 0 10*3/uL (ref 0.0–0.1)
Immature Granulocytes: 0 %
Lymphocytes Absolute: 4.6 10*3/uL — ABNORMAL HIGH (ref 0.7–3.1)
Lymphs: 44 %
MCH: 28.4 pg (ref 26.6–33.0)
MCHC: 32.6 g/dL (ref 31.5–35.7)
MCV: 87 fL (ref 79–97)
Monocytes Absolute: 0.5 10*3/uL (ref 0.1–0.9)
Monocytes: 5 %
Neutrophils Absolute: 5 10*3/uL (ref 1.4–7.0)
Neutrophils: 46 %
Platelets: 397 10*3/uL (ref 150–450)
RBC: 4.65 x10E6/uL (ref 3.77–5.28)
RDW: 13.9 % (ref 11.7–15.4)
WBC: 10.6 10*3/uL (ref 3.4–10.8)

## 2020-03-22 LAB — COMPREHENSIVE METABOLIC PANEL
ALT: 20 IU/L (ref 0–32)
AST: 16 IU/L (ref 0–40)
Albumin/Globulin Ratio: 1.2 (ref 1.2–2.2)
Albumin: 4.1 g/dL (ref 3.8–4.8)
Alkaline Phosphatase: 133 IU/L — ABNORMAL HIGH (ref 44–121)
BUN/Creatinine Ratio: 13 (ref 9–23)
BUN: 12 mg/dL (ref 6–24)
Bilirubin Total: 0.2 mg/dL (ref 0.0–1.2)
CO2: 19 mmol/L — ABNORMAL LOW (ref 20–29)
Calcium: 9.5 mg/dL (ref 8.7–10.2)
Chloride: 105 mmol/L (ref 96–106)
Creatinine, Ser: 0.91 mg/dL (ref 0.57–1.00)
GFR calc Af Amer: 85 mL/min/{1.73_m2} (ref >59)
GFR calc non Af Amer: 74 mL/min/{1.73_m2} (ref >59)
Globulin, Total: 3.5 g/dL (ref 1.5–4.5)
Glucose: 105 mg/dL — ABNORMAL HIGH (ref 65–99)
Potassium: 4.5 mmol/L (ref 3.5–5.2)
Sodium: 143 mmol/L (ref 134–144)
Total Protein: 7.6 g/dL (ref 6.0–8.5)

## 2020-03-22 LAB — TSH: TSH: 1.38 u[IU]/mL (ref 0.450–4.500)

## 2020-03-22 LAB — LIPID PANEL
Chol/HDL Ratio: 4.4 ratio (ref 0.0–4.4)
Cholesterol, Total: 201 mg/dL — ABNORMAL HIGH (ref 100–199)
HDL: 46 mg/dL (ref >39)
LDL Chol Calc (NIH): 131 mg/dL — ABNORMAL HIGH (ref 0–99)
Triglycerides: 134 mg/dL (ref 0–149)
VLDL Cholesterol Cal: 24 mg/dL (ref 5–40)

## 2020-03-22 LAB — VITAMIN D 25 HYDROXY (VIT D DEFICIENCY, FRACTURES): Vit D, 25-Hydroxy: 8.3 ng/mL — ABNORMAL LOW (ref 30.0–100.0)

## 2020-03-22 LAB — VITAMIN B12: Vitamin B-12: 548 pg/mL (ref 232–1245)

## 2020-03-24 ENCOUNTER — Encounter: Payer: Self-pay | Admitting: Family Medicine

## 2020-03-27 ENCOUNTER — Encounter: Payer: Self-pay | Admitting: Family Medicine

## 2020-03-27 ENCOUNTER — Other Ambulatory Visit: Payer: Self-pay | Admitting: Family Medicine

## 2020-03-27 DIAGNOSIS — E559 Vitamin D deficiency, unspecified: Secondary | ICD-10-CM

## 2020-03-27 MED ORDER — VITAMIN D (ERGOCALCIFEROL) 1.25 MG (50000 UNIT) PO CAPS: 50000.0000 [IU] | ORAL_CAPSULE | ORAL | 6 refills | Status: DC

## 2020-03-27 NOTE — Progress Notes (Signed)
Pt was called to discuss the lab results . Pt did not answer the phone so a message was left to give [place] a call back.

## 2020-04-22 ENCOUNTER — Other Ambulatory Visit: Payer: Self-pay

## 2020-04-22 ENCOUNTER — Ambulatory Visit (INDEPENDENT_AMBULATORY_CARE_PROVIDER_SITE_OTHER): Payer: Managed Care, Other (non HMO) | Admitting: Family Medicine

## 2020-04-22 VITALS — BP 130/90 | HR 88 | Temp 98.2°F | Resp 20 | Wt 322.4 lb

## 2020-04-22 DIAGNOSIS — E559 Vitamin D deficiency, unspecified: Secondary | ICD-10-CM | POA: Diagnosis not present

## 2020-04-22 DIAGNOSIS — M109 Gout, unspecified: Secondary | ICD-10-CM | POA: Diagnosis not present

## 2020-04-22 DIAGNOSIS — R634 Abnormal weight loss: Secondary | ICD-10-CM | POA: Diagnosis not present

## 2020-04-22 DIAGNOSIS — I10 Essential (primary) hypertension: Secondary | ICD-10-CM

## 2020-04-22 DIAGNOSIS — Z09 Encounter for follow-up examination after completed treatment for conditions other than malignant neoplasm: Secondary | ICD-10-CM

## 2020-04-22 NOTE — Progress Notes (Signed)
[facility]   Established Patient Office Visit  Subjective:  Patient ID: Jessica Schaefer, female    DOB: 06/24/1970  Age: 49 y.o. MRN: [id number]  CC: No chief complaint on file.   HPI Jessica Schaefer is a 49 year old female who presents for Follow Up today.   There are no problems to display for this patient.   Current Status: Since her last office visit, she is doing well with no complaints. She has had a significant weight loss since her last office visit. She states that she is eating a healthier diet, increasing her water intake, not eating after 8 pm, etc. She denies fevers, chills, fatigue, recent infections, weight loss, and night sweats. She has not had any headaches, visual changes, dizziness, and falls. No chest pain, heart palpitations, cough and shortness of breath reported. Denies GI problems such as nausea, vomiting, diarrhea, and constipation. She has no reports of blood in stools, dysuria and hematuria. She is taking all medications as prescribed. She denies pain today.   Past Medical History:  Diagnosis Date  . Anemia 1995  . Gout   . Hypertension   . Vitamin D deficiency 03/2020    Past Surgical History:  Procedure Laterality Date  . BREAST SURGERY     REDUCTION; 1997-1998    Family History  Problem Relation Age of Onset  . Early death Mother   . Hypertension Mother   . Obesity Mother     Social History   Socioeconomic History  . Marital status: Single    Spouse name: Not on file  . Number of children: Not on file  . Years of education: Not on file  . Highest education level: Not on file  Occupational History  . Not on file  Tobacco Use  . Smoking status: Never Smoker  . Smokeless tobacco: Never Used  Vaping Use  . Vaping Use: Never assessed  Substance and Sexual Activity  . Alcohol use: No  . Drug use: No  . Sexual activity: Not Currently  Other Topics Concern  . Not on file    Social History Narrative  . Not on file   Social Determinants of Health   Financial Resource Strain:   . Difficulty of Paying Living Expenses: Not on file  Food Insecurity:   . Worried About Programme researcher, broadcasting/film/video in the Last Year: Not on file  . Ran Out of Food in the Last Year: Not on file  Transportation Needs:   . Lack of Transportation (Medical): Not on file  . Lack of Transportation (Non-Medical): Not on file  Physical Activity:   . Days of Exercise per Week: Not on file  . Minutes of Exercise per Session: Not on file  Stress:   . Feeling of Stress : Not on file  Social Connections:   . Frequency of Communication with Friends and Family: Not on file  . Frequency of Social Gatherings with Friends and Family: Not on file  . Attends Religious Services: Not on file  . Active Member of Clubs or Organizations: Not on file  . Attends Banker Meetings: Not on file  . Marital Status: Not on file  Intimate Partner Violence:   . Fear of Current or Ex-Partner: Not on file  . Emotionally Abused: Not on file  . Physically Abused: Not on file  . Sexually Abused: Not on file    Outpatient Medications Prior to Visit  Medication  Sig Dispense Refill  . allopurinol (ZYLOPRIM) 100 MG tablet Take 1 tablet (100 mg total) by mouth daily. 90 tablet 3  . colchicine 0.6 MG tablet Take 1 tablet 3 times a day on the fisrt day of flare up (Maximum daily dose).  Thereafter, take 1 tablet 2 times a day until gouty flare up resolves. 30 tablet 3  . hydrochlorothiazide (HYDRODIURIL) 25 MG tablet Take 1 tablet (25 mg total) by mouth daily. 90 tablet 3  . metFORMIN (GLUCOPHAGE) 500 MG tablet Take 1 tablet (500 mg total) by mouth 2 (two) times daily with a meal. 60 tablet 1  . Vitamin D, Ergocalciferol, (DRISDOL) 1.25 MG (50000 UNIT) CAPS capsule Take 1 capsule (50,000 Units total) by mouth every 7 (seven) days. 5 capsule 6  . lisinopril (ZESTRIL) 20 MG tablet Take 1 tablet (20 mg total) by  mouth daily. 90 tablet 3   No facility-administered medications prior to visit.    No Known Allergies  ROS Review of Systems  Constitutional: Negative.   HENT: Negative.   Eyes: Negative.   Respiratory: Negative.   Cardiovascular: Negative.   Gastrointestinal: Positive for abdominal distention (obese).  Endocrine: Negative.   Genitourinary: Negative.   Musculoskeletal: Positive for arthralgias (generalized joint pain).       Gouty arthritis; bilateral knee pain  Skin: Negative.   Allergic/Immunologic: Negative.   Neurological: Positive for dizziness (occasional ) and headaches (occasional ).  Hematological: Negative.   Psychiatric/Behavioral: Negative.       Objective:    Physical Exam Vitals and nursing note reviewed.  Constitutional:      Appearance: Normal appearance.  HENT:     Head: Normocephalic and atraumatic.     Nose: Nose normal.     Mouth/Throat:     Mouth: Mucous membranes are moist.     Pharynx: Oropharynx is clear.  Cardiovascular:     Rate and Rhythm: Normal rate and regular rhythm.     Pulses: Normal pulses.     Heart sounds: Normal heart sounds.  Pulmonary:     Effort: Pulmonary effort is normal.     Breath sounds: Normal breath sounds.  Abdominal:     General: Bowel sounds are normal. There is distension (obese).     Palpations: Abdomen is soft.  Musculoskeletal:        General: Normal range of motion.     Cervical back: Normal range of motion and neck supple.  Skin:    General: Skin is warm and dry.  Neurological:     General: No focal deficit present.     Mental Status: She is alert and oriented to person, place, and time.  Psychiatric:        Mood and Affect: Mood normal.        Behavior: Behavior normal.        Thought Content: Thought content normal.        Judgment: Judgment normal.    BP 130/90   Pulse 88   Temp 98.2 F (36.8 C)   Resp 20   Wt (!) 322 lb 6.4 oz (146.2 kg)   SpO2 100%  Wt Readings from Last 3 Encounters:   04/22/20 (!) 322 lb 6.4 oz (146.2 kg)    Health Maintenance Due  Topic Date Due  . Hepatitis C Screening  Never done  . HIV Screening  Never done  . PAP SMEAR-Modifier  07/23/2016  . MAMMOGRAM  Never done  . COLONOSCOPY  Never done  . INFLUENZA VACCINE  Never done    There are no preventive care reminders to display for this patient.  Lab Results  Component Value Date   TSH 1.380 03/21/2020   Lab Results  Component Value Date   WBC 10.6 03/21/2020   HGB 13.2 03/21/2020   HCT 40.5 03/21/2020   MCV 87 03/21/2020   PLT 397 03/21/2020   Lab Results  Component Value Date   NA 143 03/21/2020   K 4.5 03/21/2020   CO2 19 (L) 03/21/2020   GLUCOSE 105 (H) 03/21/2020   BUN 12 03/21/2020   CREATININE 0.91 03/21/2020   BILITOT 0.2 03/21/2020   ALKPHOS 133 (H) 03/21/2020   AST 16 03/21/2020   ALT 20 03/21/2020   PROT 7.6 03/21/2020   ALBUMIN 4.1 03/21/2020   CALCIUM 9.5 03/21/2020   Lab Results  Component Value Date   CHOL 201 (H) 03/21/2020   Lab Results  Component Value Date   HDL 46 03/21/2020   Lab Results  Component Value Date   LDLCALC 131 (H) 03/21/2020   Lab Results  Component Value Date   TRIG 134 03/21/2020   Lab Results  Component Value Date   CHOLHDL 4.4 03/21/2020   No results found for: HGBA1C    Assessment & Plan:   1. Hypertension, unspecified type The current medical regimen is effective; blood pressure is stable at 130/90 today; continue present plan and medications as prescribed. She will continue to take medications as prescribed, to decrease high sodium intake, excessive alcohol intake, increase potassium intake, smoking cessation, and increase physical activity of at least 30 minutes of cardio activity daily. She will continue to follow Heart Healthy or DASH diet.  2. Gout, unspecified cause, unspecified chronicity, unspecified site Stable.   3. Vitamin D deficiency  4. Weight loss  5. Follow up She will follow up in 3 months.    No orders of the defined types were placed in this encounter.   No orders of the defined types were placed in this encounter.   Referral Orders  No referral(s) requested today    Raliegh Ip,  MSN, FNP-BC [facility] [address] [place], [place] [address] [phone]- fax   Problem List Items Addressed This Visit    None    Visit Diagnoses    Hypertension, unspecified type    -  Primary   Gout, unspecified cause, unspecified chronicity, unspecified site       Vitamin D deficiency       Weight loss       Follow up          No orders of the defined types were placed in this encounter.   Follow-up: Return in about 3 months (around 07/23/2020).    Kallie Locks, FNP

## 2020-04-28 ENCOUNTER — Encounter: Payer: Self-pay | Admitting: Family Medicine

## 2020-05-05 ENCOUNTER — Ambulatory Visit: Payer: Managed Care, Other (non HMO) | Admitting: Family Medicine

## 2020-06-17 ENCOUNTER — Telehealth: Payer: Self-pay | Admitting: Family Medicine

## 2020-06-17 ENCOUNTER — Other Ambulatory Visit: Payer: Self-pay | Admitting: Family Medicine

## 2020-06-17 DIAGNOSIS — M109 Gout, unspecified: Secondary | ICD-10-CM

## 2020-06-17 MED ORDER — COLCHICINE 0.6 MG PO CAPS: ORAL_CAPSULE | ORAL | 3 refills | Status: DC

## 2020-06-17 NOTE — Telephone Encounter (Signed)
Done

## 2020-07-02 ENCOUNTER — Encounter (HOSPITAL_COMMUNITY): Payer: Self-pay

## 2020-07-02 ENCOUNTER — Emergency Department (HOSPITAL_COMMUNITY): Payer: Managed Care, Other (non HMO)

## 2020-07-02 ENCOUNTER — Other Ambulatory Visit: Payer: Self-pay

## 2020-07-02 ENCOUNTER — Observation Stay (HOSPITAL_COMMUNITY)
Admission: EM | Admit: 2020-07-02 | Discharge: 2020-07-04 | Disposition: A | Discharge status: Home / Self Care | Payer: Managed Care, Other (non HMO) | Attending: Emergency Medicine | Admitting: Emergency Medicine

## 2020-07-02 DIAGNOSIS — I10 Essential (primary) hypertension: Secondary | ICD-10-CM | POA: Diagnosis not present

## 2020-07-02 DIAGNOSIS — Z20822 Contact with and (suspected) exposure to covid-19: Secondary | ICD-10-CM | POA: Diagnosis not present

## 2020-07-02 DIAGNOSIS — R1011 Right upper quadrant pain: Secondary | ICD-10-CM | POA: Diagnosis present

## 2020-07-02 DIAGNOSIS — K810 Acute cholecystitis: Secondary | ICD-10-CM | POA: Diagnosis present

## 2020-07-02 DIAGNOSIS — K8000 Calculus of gallbladder with acute cholecystitis without obstruction: Principal | ICD-10-CM | POA: Insufficient documentation

## 2020-07-02 DIAGNOSIS — Z79899 Other long term (current) drug therapy: Secondary | ICD-10-CM | POA: Insufficient documentation

## 2020-07-02 DIAGNOSIS — R52 Pain, unspecified: Secondary | ICD-10-CM

## 2020-07-02 LAB — CBC
HCT: 43.9 % (ref 36.0–46.0)
HCT: 44.2 % (ref 36.0–46.0)
Hemoglobin: 14.1 g/dL (ref 12.0–15.0)
Hemoglobin: 13.8 g/dL (ref 12.0–15.0)
MCH: 29.4 pg (ref 26.0–34.0)
MCH: 29.2 pg (ref 26.0–34.0)
MCHC: 32.1 g/dL (ref 30.0–36.0)
MCHC: 31.2 g/dL (ref 30.0–36.0)
MCV: 91.5 fL (ref 80.0–100.0)
MCV: 93.4 fL (ref 80.0–100.0)
Platelets: 437 10*3/uL — ABNORMAL HIGH (ref 150–400)
Platelets: 442 10*3/uL — ABNORMAL HIGH (ref 150–400)
RBC: 4.8 MIL/uL (ref 3.87–5.11)
RBC: 4.73 MIL/uL (ref 3.87–5.11)
RDW: 14.1 % (ref 11.5–15.5)
RDW: 14.3 % (ref 11.5–15.5)
WBC: 15.7 10*3/uL — ABNORMAL HIGH (ref 4.0–10.5)
WBC: 15 10*3/uL — ABNORMAL HIGH (ref 4.0–10.5)
nRBC: 0 % (ref 0.0–0.2)
nRBC: 0 % (ref 0.0–0.2)

## 2020-07-02 LAB — COMPREHENSIVE METABOLIC PANEL
ALT: 16 U/L (ref 0–44)
AST: 17 U/L (ref 15–41)
Albumin: 3.9 g/dL (ref 3.5–5.0)
Alkaline Phosphatase: 102 U/L (ref 38–126)
Anion gap: 12 (ref 5–15)
BUN: 12 mg/dL (ref 6–20)
CO2: 24 mmol/L (ref 22–32)
Calcium: 9.5 mg/dL (ref 8.9–10.3)
Chloride: 102 mmol/L (ref 98–111)
Creatinine, Ser: 0.72 mg/dL (ref 0.44–1.00)
GFR, Estimated: >60 mL/min (ref >60)
Glucose, Bld: 115 mg/dL — ABNORMAL HIGH (ref 70–99)
Potassium: 3.2 mmol/L — ABNORMAL LOW (ref 3.5–5.1)
Sodium: 138 mmol/L (ref 135–145)
Total Bilirubin: 1 mg/dL (ref 0.3–1.2)
Total Protein: 8.4 g/dL — ABNORMAL HIGH (ref 6.5–8.1)

## 2020-07-02 LAB — RESP PANEL BY RT-PCR (FLU A&B, COVID) ARPGX2
Influenza A by PCR: NEGATIVE
Influenza B by PCR: NEGATIVE
SARS Coronavirus 2 by RT PCR: NEGATIVE

## 2020-07-02 LAB — CREATININE, SERUM
Creatinine, Ser: 1.24 mg/dL — ABNORMAL HIGH (ref 0.44–1.00)
GFR, Estimated: 53 mL/min — ABNORMAL LOW (ref >60)

## 2020-07-02 LAB — URINALYSIS, ROUTINE W REFLEX MICROSCOPIC
Bilirubin Urine: NEGATIVE
Glucose, UA: NEGATIVE mg/dL
Hgb urine dipstick: NEGATIVE
Ketones, ur: NEGATIVE mg/dL
Leukocytes,Ua: NEGATIVE
Nitrite: NEGATIVE
Protein, ur: 30 mg/dL — AB
Specific Gravity, Urine: 1.02 (ref 1.005–1.030)
pH: 5 (ref 5.0–8.0)

## 2020-07-02 LAB — HIV ANTIBODY (ROUTINE TESTING W REFLEX): HIV Screen 4th Generation wRfx: NONREACTIVE

## 2020-07-02 LAB — I-STAT BETA HCG BLOOD, ED (MC, WL, AP ONLY): I-stat hCG, quantitative: <5 m[IU]/mL (ref <5)

## 2020-07-02 LAB — LIPASE, BLOOD: Lipase: 21 U/L (ref 11–51)

## 2020-07-02 MED ORDER — ENOXAPARIN SODIUM 40 MG/0.4ML ~~LOC~~ SOLN
40.0000 mg | SUBCUTANEOUS | Status: DC
  Administered 2020-07-02 – 2020-07-03 (×2): 40 mg via SUBCUTANEOUS
  Filled 2020-07-02 (×2): qty 0.4

## 2020-07-02 MED ORDER — PROCHLORPERAZINE MALEATE 10 MG PO TABS
10.0000 mg | ORAL_TABLET | Freq: Four times a day (QID) | ORAL | Status: DC | PRN
  Filled 2020-07-02: qty 1

## 2020-07-02 MED ORDER — PANTOPRAZOLE SODIUM 40 MG IV SOLR
40.0000 mg | Freq: Once | INTRAVENOUS | Status: AC
  Administered 2020-07-02: 40 mg via INTRAVENOUS
  Filled 2020-07-02: qty 40

## 2020-07-02 MED ORDER — ONDANSETRON HCL 4 MG/2ML IJ SOLN
4.0000 mg | Freq: Once | INTRAMUSCULAR | Status: AC
  Administered 2020-07-02: 4 mg via INTRAVENOUS
  Filled 2020-07-02: qty 2

## 2020-07-02 MED ORDER — HYDROMORPHONE HCL 1 MG/ML IJ SOLN
0.5000 mg | Freq: Once | INTRAMUSCULAR | Status: AC
  Administered 2020-07-02: 0.5 mg via INTRAVENOUS
  Filled 2020-07-02: qty 1

## 2020-07-02 MED ORDER — METOPROLOL TARTRATE 5 MG/5ML IV SOLN: 5.0000 mg | Freq: Four times a day (QID) | INTRAVENOUS | Status: DC | PRN

## 2020-07-02 MED ORDER — DIPHENHYDRAMINE HCL 50 MG/ML IJ SOLN: 25.0000 mg | Freq: Four times a day (QID) | INTRAMUSCULAR | Status: DC | PRN

## 2020-07-02 MED ORDER — DIPHENHYDRAMINE HCL 25 MG PO CAPS: 25.0000 mg | ORAL_CAPSULE | Freq: Four times a day (QID) | ORAL | Status: DC | PRN

## 2020-07-02 MED ORDER — SODIUM CHLORIDE 0.9 % IV SOLN
2.0000 g | Freq: Every day | INTRAVENOUS | Status: DC
  Administered 2020-07-02 – 2020-07-03 (×2): 2 g via INTRAVENOUS
  Filled 2020-07-02 (×2): qty 20
  Filled 2020-07-02: qty 2
  Filled 2020-07-02: qty 20

## 2020-07-02 MED ORDER — MORPHINE SULFATE (PF) 2 MG/ML IV SOLN
2.0000 mg | INTRAVENOUS | Status: DC | PRN
  Administered 2020-07-03 (×2): 2 mg via INTRAVENOUS
  Filled 2020-07-02 (×2): qty 1

## 2020-07-02 MED ORDER — ACETAMINOPHEN 650 MG RE SUPP: 650.0000 mg | Freq: Four times a day (QID) | RECTAL | Status: DC | PRN

## 2020-07-02 MED ORDER — ACETAMINOPHEN 325 MG PO TABS
650.0000 mg | ORAL_TABLET | Freq: Four times a day (QID) | ORAL | Status: DC | PRN
  Administered 2020-07-03: 650 mg via ORAL
  Filled 2020-07-02: qty 2

## 2020-07-02 MED ORDER — POLYETHYLENE GLYCOL 3350 17 G PO PACK: 17.0000 g | PACK | Freq: Every day | ORAL | Status: DC | PRN

## 2020-07-02 MED ORDER — OXYCODONE HCL 5 MG PO TABS
5.0000 mg | ORAL_TABLET | ORAL | Status: DC | PRN
  Administered 2020-07-02 (×2): 10 mg via ORAL
  Administered 2020-07-03 – 2020-07-04 (×2): 5 mg via ORAL
  Filled 2020-07-02: qty 2
  Filled 2020-07-02: qty 1
  Filled 2020-07-02: qty 2

## 2020-07-02 MED ORDER — PROCHLORPERAZINE EDISYLATE 10 MG/2ML IJ SOLN: 5.0000 mg | Freq: Four times a day (QID) | INTRAMUSCULAR | Status: DC | PRN

## 2020-07-02 MED ORDER — ONDANSETRON HCL 4 MG/2ML IJ SOLN: 4.0000 mg | Freq: Four times a day (QID) | INTRAMUSCULAR | Status: DC | PRN

## 2020-07-02 MED ORDER — HYDROMORPHONE HCL 1 MG/ML IJ SOLN
1.0000 mg | Freq: Once | INTRAMUSCULAR | Status: AC
  Administered 2020-07-02: 1 mg via INTRAVENOUS
  Filled 2020-07-02: qty 1

## 2020-07-02 MED ORDER — POTASSIUM CHLORIDE IN NACL 20-0.45 MEQ/L-% IV SOLN
INTRAVENOUS | Status: DC
  Filled 2020-07-02 (×2): qty 1000

## 2020-07-02 MED ORDER — ONDANSETRON 4 MG PO TBDP: 4.0000 mg | ORAL_TABLET | Freq: Four times a day (QID) | ORAL | Status: DC | PRN

## 2020-07-02 NOTE — Plan of Care (Signed)
  Problem: Education: Goal: Knowledge of General Education information will improve Description: Including pain rating scale, medication(s)/side effects and non-pharmacologic comfort measures Outcome: Progressing   Problem: Health Behavior/Discharge Planning: Goal: Ability to manage health-related needs will improve Outcome: Progressing   Problem: Clinical Measurements: Goal: Ability to maintain clinical measurements within normal limits will improve Outcome: Progressing Goal: Will remain free from infection Outcome: Progressing Goal: Diagnostic test results will improve Outcome: Progressing Goal: Respiratory complications will improve Outcome: Progressing Goal: Cardiovascular complication will be avoided Outcome: Progressing   Problem: Activity: Goal: Risk for activity intolerance will decrease Outcome: Progressing   Problem: Nutrition: Goal: Adequate nutrition will be maintained Outcome: Progressing   Problem: Coping: Goal: Level of anxiety will decrease Outcome: Progressing   Problem: Elimination: Goal: Will not experience complications related to bowel motility Outcome: Progressing Goal: Will not experience complications related to urinary retention Outcome: Progressing   Problem: Pain Managment: Goal: General experience of comfort will improve Outcome: Progressing   Problem: Safety: Goal: Ability to remain free from injury will improve Outcome: Progressing   Problem: Skin Integrity: Goal: Risk for impaired skin integrity will decrease Outcome: Progressing   Problem: Education: Goal: Knowledge of medication regimen will be met for pain relief regimen by discharge Outcome: Progressing Goal: Understanding of ways to prevent infection will improve by discharge Outcome: Progressing   Problem: Pain Management: Goal: Satisfaction with pain management regimen will be met by discharge Outcome: Progressing

## 2020-07-02 NOTE — ED Notes (Signed)
Called report to Heather, RN

## 2020-07-02 NOTE — ED Triage Notes (Signed)
Pt arrived via walk in, c/o right sided flank pain, radiating to back x2 weeks. States pain intermittent and aching. Denies any n/v, diarrhea, or any urinary sx.

## 2020-07-02 NOTE — ED Provider Notes (Signed)
[facility]-EMERGENCY DEPT Provider Note   CSN: [id number] Arrival date & time: 07/02/20  8546     History Chief Complaint  Patient presents with  . Flank Pain    Jessica Schaefer is a 50 y.o. female.  Patient complains of right upper quadrant abdominal pain and right flank pain.  Mild nausea this been going on for few days off-and-on  The history is provided by the patient and medical records. No language interpreter was used.  Flank Pain This is a new problem. The current episode started more than 2 days ago. The problem occurs constantly. The problem has not changed since onset.Pertinent negatives include no chest pain, no abdominal pain and no headaches. Nothing aggravates the symptoms. Nothing relieves the symptoms. She has tried nothing for the symptoms. The treatment provided no relief.       Past Medical History:  Diagnosis Date  . Anemia 1995  . Gout   . Hypertension   . Vitamin D deficiency 03/2020    Patient Active Problem List   Diagnosis Date Noted  . Acute cholecystitis 07/02/2020    Past Surgical History:  Procedure Laterality Date  . BREAST SURGERY     REDUCTION; 1997-1998     OB History   No obstetric history on file.     Family History  Problem Relation Age of Onset  . Early death Mother   . Hypertension Mother   . Obesity Mother     Social History   Tobacco Use  . Smoking status: Never Smoker  . Smokeless tobacco: Never Used  Substance Use Topics  . Alcohol use: No  . Drug use: No    Home Medications Prior to Admission medications   Medication Sig Start Date End Date Taking? Authorizing Provider  allopurinol (ZYLOPRIM) 100 MG tablet Take 1 tablet (100 mg total) by mouth daily. 03/21/20  Yes Kallie Locks, FNP  Aspirin-Acetaminophen-Caffeine (GOODY HEADACHE PO) Take 1 packet by mouth as needed (headache/pain).   Yes [provider]  hydrochlorothiazide (HYDRODIURIL) 25 MG tablet Take 1 tablet  (25 mg total) by mouth daily. 03/21/20  Yes Kallie Locks, FNP  ibuprofen (ADVIL) 800 MG tablet Take 800 mg by mouth every 8 (eight) hours as needed for fever, headache or mild pain.   Yes [provider]  lisinopril (ZESTRIL) 20 MG tablet Take 1 tablet (20 mg total) by mouth daily. 03/21/20  Yes Kallie Locks, FNP  metFORMIN (GLUCOPHAGE) 500 MG tablet Take 1 tablet (500 mg total) by mouth 2 (two) times daily with a meal. 03/21/20  Yes Kallie Locks, FNP  Vitamin D, Ergocalciferol, (DRISDOL) 1.25 MG (50000 UNIT) CAPS capsule Take 1 capsule (50,000 Units total) by mouth every 7 (seven) days. 03/27/20  Yes Kallie Locks, FNP  Colchicine (MITIGARE) 0.6 MG CAPS Take 1 tablet 3 times a day on the fisrt day of flare up (Maximum daily dose). Thereafter, take 1 tablet 2 times a day until gouty flare up resolves. Patient not taking: Reported on 07/02/2020 06/17/20   Kallie Locks, FNP    Allergies    Patient has no known allergies.  Review of Systems   Review of Systems  Constitutional: Negative for appetite change and fatigue.  HENT: Negative for congestion, ear discharge and sinus pressure.   Eyes: Negative for discharge.  Respiratory: Negative for cough.   Cardiovascular: Negative for chest pain.  Gastrointestinal: Negative for abdominal pain and diarrhea.       Abdominal  pain  Genitourinary: Positive for flank pain. Negative for frequency and hematuria.  Musculoskeletal: Negative for back pain.  Skin: Negative for rash.  Neurological: Negative for seizures and headaches.  Psychiatric/Behavioral: Negative for hallucinations.    Physical Exam Updated Vital Signs BP (!) 175/105   Pulse (!) 112   Temp 98 F (36.7 C) (Oral)   Resp (!) 22   SpO2 97%   Physical Exam Vitals and nursing note reviewed.  Constitutional:      Appearance: She is well-developed.  HENT:     Head: Normocephalic.     Nose: Nose normal.  Eyes:     General: No scleral icterus.     Extraocular Movements: EOM normal.     Conjunctiva/sclera: Conjunctivae normal.  Neck:     Thyroid: No thyromegaly.  Cardiovascular:     Rate and Rhythm: Normal rate and regular rhythm.     Heart sounds: No murmur heard. No friction rub. No gallop.   Pulmonary:     Breath sounds: No stridor. No wheezing or rales.  Chest:     Chest wall: No tenderness.  Abdominal:     General: There is no distension.     Tenderness: There is abdominal tenderness. There is no rebound.  Musculoskeletal:        General: No edema. Normal range of motion.     Cervical back: Neck supple.  Lymphadenopathy:     Cervical: No cervical adenopathy.  Skin:    Findings: No erythema or rash.  Neurological:     Mental Status: She is alert and oriented to person, place, and time.     Motor: No abnormal muscle tone.     Coordination: Coordination normal.  Psychiatric:        Mood and Affect: Mood and affect normal.        Behavior: Behavior normal.     ED Results / Procedures / Treatments   Labs (all labs ordered are listed, but only abnormal results are displayed) Labs Reviewed  COMPREHENSIVE METABOLIC PANEL - Abnormal; Notable for the following components:      Result Value   Potassium 3.2 (*)    Glucose, Bld 115 (*)    Total Protein 8.4 (*)    All other components within normal limits  CBC - Abnormal; Notable for the following components:   WBC 15.7 (*)    Platelets 437 (*)    All other components within normal limits  URINALYSIS, ROUTINE W REFLEX MICROSCOPIC - Abnormal; Notable for the following components:   Protein, ur 30 (*)    Bacteria, UA RARE (*)    All other components within normal limits  RESP PANEL BY RT-PCR (FLU A&B, COVID) ARPGX2  LIPASE, BLOOD  HIV ANTIBODY (ROUTINE TESTING W REFLEX)  CBC  CREATININE, SERUM  I-STAT BETA HCG BLOOD, ED ([facility], [facility], [facility] ONLY)    EKG None  Radiology CT Renal Stone Study  Result Date: 07/02/2020 CLINICAL DATA:  Right flank pain for 2 weeks. EXAM:  CT ABDOMEN AND PELVIS WITHOUT CONTRAST TECHNIQUE: Multidetector CT imaging of the abdomen and pelvis was performed following the standard protocol without IV contrast. COMPARISON:  Pelvic ultrasound 10/10/2009 FINDINGS: Lower chest: Bibasilar scarring. Normal heart size without pericardial or pleural effusion. Hepatobiliary: Moderate hepatic steatosis and mild hepatomegaly at 18.7 craniocaudal. Stone filled gallbladder, without acute cholecystitis or biliary duct dilatation. Pancreas: Normal, without mass or ductal dilatation. Spleen: Normal in size, without focal abnormality. Adrenals/Urinary Tract: Normal adrenal glands. No renal calculi or hydronephrosis.  No hydroureter or ureteric calculi. No bladder calculi. Stomach/Bowel: Normal stomach, without wall thickening. Normal colon, appendix, and terminal ileum. Normal small bowel. Vascular/Lymphatic: Normal caliber of the aorta and branch vessels. No abdominopelvic adenopathy. Reproductive: Normal uterus and adnexa. Other: No significant free fluid.  Mild pelvic floor laxity. Musculoskeletal: Advanced lumbosacral spondylosis. IMPRESSION: 1. No urinary tract calculi or hydronephrosis. 2. Hepatic steatosis and hepatomegaly. 3. Cholelithiasis. Electronically Signed   By: Jeronimo Greaves M.D.   On: 07/02/2020 11:53   US Abdomen Limited RUQ (LIVER/GB)  Result Date: 07/02/2020 CLINICAL DATA:  Right-sided abdominal pain EXAM: ULTRASOUND ABDOMEN LIMITED RIGHT UPPER QUADRANT COMPARISON:  Same-day CT FINDINGS: Technical note: Examination is limited secondary to poor penetration from patient body habitus. Gallbladder: Gallbladder lumen completely filled with stones with a wall-echo-shadow appearance. No pericholecystic fluid is evident. Sonographer did not report the presence of a Murphy sign. Common bile duct: Diameter: 6 mm. Liver: Limited. No focal lesion identified. Within normal limits in parenchymal echogenicity. Portal vein is patent on color Doppler imaging with  normal direction of blood flow towards the liver. Other: None. IMPRESSION: 1. Limited exam.  Liver poorly visualized. 2. Cholelithiasis without sonographic evidence of cholecystitis. Electronically Signed   By: Duanne Guess D.O.   On: 07/02/2020 12:44    Procedures Procedures (including critical care time)  Medications Ordered in ED Medications  enoxaparin (LOVENOX) injection 40 mg (has no administration in time range)  0.45 % NaCl with KCl 20 mEq / L infusion (has no administration in time range)  cefTRIAXone (ROCEPHIN) 2 g in sodium chloride 0.9 % 100 mL IVPB (has no administration in time range)  acetaminophen (TYLENOL) tablet 650 mg (has no administration in time range)    Or  acetaminophen (TYLENOL) suppository 650 mg (has no administration in time range)  oxyCODONE (Oxy IR/ROXICODONE) immediate release tablet 5-10 mg (has no administration in time range)  morphine 2 MG/ML injection 2-4 mg (has no administration in time range)  diphenhydrAMINE (BENADRYL) capsule 25 mg (has no administration in time range)    Or  diphenhydrAMINE (BENADRYL) injection 25 mg (has no administration in time range)  polyethylene glycol (MIRALAX / GLYCOLAX) packet 17 g (has no administration in time range)  ondansetron (ZOFRAN-ODT) disintegrating tablet 4 mg (has no administration in time range)    Or  ondansetron (ZOFRAN) injection 4 mg (has no administration in time range)  prochlorperazine (COMPAZINE) tablet 10 mg (has no administration in time range)    Or  prochlorperazine (COMPAZINE) injection 5-10 mg (has no administration in time range)  metoprolol tartrate (LOPRESSOR) injection 5 mg (has no administration in time range)  ondansetron (ZOFRAN) injection 4 mg (4 mg Intravenous Given 07/02/20 1005)  HYDROmorphone (DILAUDID) injection 1 mg (1 mg Intravenous Given 07/02/20 1007)  HYDROmorphone (DILAUDID) injection 0.5 mg (0.5 mg Intravenous Given 07/02/20 1219)  pantoprazole (PROTONIX) injection 40 mg  (40 mg Intravenous Given 07/02/20 1224)    ED Course  I have reviewed the triage vital signs and the nursing notes.  Pertinent labs & imaging results that were available during my care of the patient were reviewed by me and considered in my medical decision making (see chart for details).    MDM Rules/Calculators/A&P                           Patient with right upper quadrant abdominal pain.  CT scan shows gallstones White count elevated 16,000.  Patient mildly tachycardic.  Ultrasound does  not show cholecystitis although is a poor study.  Patient was seen by general surgery and they will admit and observe the patient Final Clinical Impression(s) / ED Diagnoses Final diagnoses:  Pain    Rx / DC Orders ED Discharge Orders    None       Bethann BerkshireZammit, Moraima Burd, MD 07/02/20 1500

## 2020-07-02 NOTE — ED Notes (Signed)
US bedside

## 2020-07-02 NOTE — ED Notes (Signed)
Pt to CT

## 2020-07-02 NOTE — ED Notes (Signed)
Attempted to call report but no one picked up

## 2020-07-02 NOTE — H&P (Signed)
Jessica Schaefer 1970/12/25  [id number].    Requesting MD: Dr. Estell Harpin Chief Complaint/Reason for Consult: Right flank pain pain, Cholelithiasis   HPI: Jessica Schaefer is a 50 y.o. female with a hx of HTN and gout who presented to [facility] ED today with right upper quadrant abdominal pain radiating to her right flank x2-weeks. Patient reports that she has intermittent upper quadrant abdominal pain that radiates to her right flank.  She is unsure what causes this.  Denies relation to eating.  No associated fever, chest pain, shortness of breath, nausea, vomiting, or urinary symptoms. No hx of similar symptoms in the past.   She is underwent workup in the ED with WBC 15.7, LFTs and Lipase wnl. CT renal stone study with cholelithiasis, but otherwise no intra-abdominal pathology.  Right upper quadrant ultrasound with cholelithiasis without evidence of acute cholecystitis.  Patient with continued pain despite 2 doses of Dilaudid at 9:45 AM and 1215AM.  We were asked to see.  No prior abdominal surgeries. She is not on any blood thinners. Denies alcohol, tobacco, illicit drug use. She is  vaccinated against covid. She works at [employer].  ROS: Review of Systems  Constitutional: Negative for chills and fever.  Respiratory: Negative for cough and shortness of breath.   Cardiovascular: Negative for chest pain and leg swelling.  Gastrointestinal: Positive for abdominal pain. Negative for constipation, diarrhea, nausea and vomiting.  Genitourinary: Negative for dysuria and urgency.  Musculoskeletal: Positive for back pain.  Psychiatric/Behavioral: Negative for substance abuse.  All other systems reviewed and are negative.   Family History  Problem Relation Age of Onset  . Early death Mother   . Hypertension Mother   . Obesity Mother     Past Medical History:  Diagnosis Date  . Anemia 1995  . Gout   . Hypertension   . Vitamin D deficiency 03/2020    Past Surgical History:  Procedure  Laterality Date  . BREAST SURGERY     REDUCTION; 1997-1998    Social History:  reports that she has never smoked. She has never used smokeless tobacco. She reports that she does not drink alcohol and does not use drugs.  Allergies: No Known Allergies  (Not in a hospital admission)    Physical Exam: Blood pressure (!) 175/105, pulse (!) 112, temperature 98 F (36.7 C), temperature source Oral, resp. rate (!) 22, SpO2 97 %.  General: pleasant, WD, obese female who is laying in bed in NAD HEENT: head is normocephalic, atraumatic.  Sclera are anicteric.  PERRL.  Ears and nose without any masses or lesions.  Mouth is pink and moist. Dentition fair Heart: regular, rate, and rhythm.  Normal s1,s2. No obvious murmurs, gallops, or rubs noted.  Palpable pedal pulses bilaterally  Lungs: CTAB, no wheezes, rhonchi, or rales noted.  Respiratory effort nonlabored Abd: Soft, ND, mild tenderness to the RUQ without rebound, rigidity or guarding. Negative Murphy's sign. +BS, no masses, hernias, or organomegaly MS: no BUE/BLE edema, calves soft and nontender Skin: warm and dry with no masses, lesions, or rashes Psych: A&Ox4 with an appropriate affect Neuro: cranial nerves grossly intact, equal strength in BUE/BLE bilaterally, normal speech, thought process intact   Results for orders placed or performed during the hospital encounter of 07/02/20 (from the past 48 hour(s))  Lipase, blood     Status: None   Collection Time: 07/02/20  8:46 AM  Result Value Ref Range   Lipase 21 11 - 51 U/L  Comment: Performed at [facility], [address] [place], [place] [address]  Comprehensive metabolic panel     Status: Abnormal   Collection Time: 07/02/20  8:46 AM  Result Value Ref Range   Sodium 138 135 - 145 mmol/L   Potassium 3.2 (L) 3.5 - 5.1 mmol/L   Chloride 102 98 - 111 mmol/L   CO2 24 22 - 32 mmol/L   Glucose, Bld 115 (H) 70 - 99 mg/dL    Comment: Glucose reference range applies  only to samples taken after fasting for at least 8 hours.   BUN 12 6 - 20 mg/dL   Creatinine, Ser 6.040.72 0.44 - 1.00 mg/dL   Calcium 9.5 8.9 - 54.010.3 mg/dL   Total Protein 8.4 (H) 6.5 - 8.1 g/dL   Albumin 3.9 3.5 - 5.0 g/dL   AST 17 15 - 41 U/L   ALT 16 0 - 44 U/L   Alkaline Phosphatase 102 38 - 126 U/L   Total Bilirubin 1.0 0.3 - 1.2 mg/dL   GFR, Estimated >98>60 >11>60 mL/min    Comment: (NOTE) Calculated using the CKD-EPI Creatinine Equation (2021)    Anion gap 12 5 - 15    Comment: Performed at [facility], [address] [place], [place] [address]  CBC     Status: Abnormal   Collection Time: 07/02/20  8:46 AM  Result Value Ref Range   WBC 15.7 (H) 4.0 - 10.5 K/uL   RBC 4.80 3.87 - 5.11 MIL/uL   Hemoglobin 14.1 12.0 - 15.0 g/dL   HCT 29.543.9 62.136.0 - 30.846.0 %   MCV 91.5 80.0 - 100.0 fL   MCH 29.4 26.0 - 34.0 pg   MCHC 32.1 30.0 - 36.0 g/dL   RDW 65.714.1 84.611.5 - 96.215.5 %   Platelets 437 (H) 150 - 400 K/uL   nRBC 0.0 0.0 - 0.2 %    Comment: Performed at [facility], [address] [place], [place] [address]  I-Stat beta hCG blood, ED     Status: None   Collection Time: 07/02/20  8:53 AM  Result Value Ref Range   I-stat hCG, quantitative <5.0 <5 mIU/mL   Comment 3            Comment:   GEST. AGE      CONC.  (mIU/mL)   <=1 WEEK        5 - 50     2 WEEKS       50 - 500     3 WEEKS       100 - 10,000     4 WEEKS     1,000 - 30,000        FEMALE AND NON-PREGNANT FEMALE:     LESS THAN 5 mIU/mL   Urinalysis, Routine w reflex microscopic Urine, Clean Catch     Status: Abnormal   Collection Time: 07/02/20  9:28 AM  Result Value Ref Range   Color, Urine YELLOW YELLOW   APPearance CLEAR CLEAR   Specific Gravity, Urine 1.020 1.005 - 1.030   pH 5.0 5.0 - 8.0   Glucose, UA NEGATIVE NEGATIVE mg/dL   Hgb urine dipstick NEGATIVE NEGATIVE   Bilirubin Urine NEGATIVE NEGATIVE   Ketones, ur NEGATIVE NEGATIVE mg/dL   Protein, ur 30 (A) NEGATIVE mg/dL   Nitrite  NEGATIVE NEGATIVE   Leukocytes,Ua NEGATIVE NEGATIVE   RBC / HPF 0-5 0 - 5 RBC/hpf   WBC, UA 0-5 0 - 5 WBC/hpf  Bacteria, UA RARE (A) NONE SEEN   Squamous Epithelial / LPF 0-5 0 - 5   Mucus PRESENT    Hyaline Casts, UA PRESENT     Comment: Performed at [facility], [address] [place], [place] [address]  Resp Panel by RT-PCR (Flu A&B, Covid) Nasopharyngeal Swab     Status: None   Collection Time: 07/02/20  1:18 PM   Specimen: Nasopharyngeal Swab; Nasopharyngeal(NP) swabs in vial transport medium  Result Value Ref Range   SARS Coronavirus 2 by RT PCR NEGATIVE NEGATIVE    Comment: (NOTE) SARS-CoV-2 target nucleic acids are NOT DETECTED.  The SARS-CoV-2 RNA is generally detectable in upper respiratory specimens during the acute phase of infection. The lowest concentration of SARS-CoV-2 viral copies this assay can detect is 138 copies/mL. A negative result does not preclude SARS-Cov-2 infection and should not be used as the sole basis for treatment or other patient management decisions. A negative result may occur with  improper specimen collection/handling, submission of specimen other than nasopharyngeal swab, presence of viral mutation(s) within the areas targeted by this assay, and inadequate number of viral copies(<138 copies/mL). A negative result must be combined with clinical observations, patient history, and epidemiological information. The expected result is Negative.  Fact Sheet for Patients:  BloggerCourse.com  Fact Sheet for Healthcare Providers:  SeriousBroker.it  This test is no t yet approved or cleared by the [place] FDA and  has been authorized for detection and/or diagnosis of SARS-CoV-2 by FDA under an Emergency Use Authorization (EUA). This EUA will remain  in effect (meaning this test can be used) for the duration of the COVID-19 declaration under Section 564(b)(1) of the Act,  21 U.S.C.section 360bbb-3(b)(1), unless the authorization is terminated  or revoked sooner.       Influenza A by PCR NEGATIVE NEGATIVE   Influenza B by PCR NEGATIVE NEGATIVE    Comment: (NOTE) The Xpert Xpress SARS-CoV-2/FLU/RSV plus assay is intended as an aid in the diagnosis of influenza from Nasopharyngeal swab specimens and should not be used as a sole basis for treatment. Nasal washings and aspirates are unacceptable for Xpert Xpress SARS-CoV-2/FLU/RSV testing.  Fact Sheet for Patients: BloggerCourse.com  Fact Sheet for Healthcare Providers: SeriousBroker.it  This test is not yet approved or cleared by the [place] FDA and has been authorized for detection and/or diagnosis of SARS-CoV-2 by FDA under an Emergency Use Authorization (EUA). This EUA will remain in effect (meaning this test can be used) for the duration of the COVID-19 declaration under Section 564(b)(1) of the Act, 21 U.S.C. section 360bbb-3(b)(1), unless the authorization is terminated or revoked.  Performed at [facility], [address], [place], [place] [address]    CT Renal Stone Study  Result Date: 07/02/2020 CLINICAL DATA:  Right flank pain for 2 weeks. EXAM: CT ABDOMEN AND PELVIS WITHOUT CONTRAST TECHNIQUE: Multidetector CT imaging of the abdomen and pelvis was performed following the standard protocol without IV contrast. COMPARISON:  Pelvic ultrasound 10/10/2009 FINDINGS: Lower chest: Bibasilar scarring. Normal heart size without pericardial or pleural effusion. Hepatobiliary: Moderate hepatic steatosis and mild hepatomegaly at 18.7 craniocaudal. Stone filled gallbladder, without acute cholecystitis or biliary duct dilatation. Pancreas: Normal, without mass or ductal dilatation. Spleen: Normal in size, without focal abnormality. Adrenals/Urinary Tract: Normal adrenal glands. No renal calculi or hydronephrosis. No hydroureter or  ureteric calculi. No bladder calculi. Stomach/Bowel: Normal stomach, without wall thickening. Normal colon, appendix, and terminal ileum. Normal small bowel. Vascular/Lymphatic: Normal caliber  of the aorta and branch vessels. No abdominopelvic adenopathy. Reproductive: Normal uterus and adnexa. Other: No significant free fluid.  Mild pelvic floor laxity. Musculoskeletal: Advanced lumbosacral spondylosis. IMPRESSION: 1. No urinary tract calculi or hydronephrosis. 2. Hepatic steatosis and hepatomegaly. 3. Cholelithiasis. Electronically Signed   By: Jeronimo Greaves M.D.   On: 07/02/2020 11:53   US Abdomen Limited RUQ (LIVER/GB)  Result Date: 07/02/2020 CLINICAL DATA:  Right-sided abdominal pain EXAM: ULTRASOUND ABDOMEN LIMITED RIGHT UPPER QUADRANT COMPARISON:  Same-day CT FINDINGS: Technical note: Examination is limited secondary to poor penetration from patient body habitus. Gallbladder: Gallbladder lumen completely filled with stones with a wall-echo-shadow appearance. No pericholecystic fluid is evident. Sonographer did not report the presence of a Murphy sign. Common bile duct: Diameter: 6 mm. Liver: Limited. No focal lesion identified. Within normal limits in parenchymal echogenicity. Portal vein is patent on color Doppler imaging with normal direction of blood flow towards the liver. Other: None. IMPRESSION: 1. Limited exam.  Liver poorly visualized. 2. Cholelithiasis without sonographic evidence of cholecystitis. Electronically Signed   By: Duanne Guess D.O.   On: 07/02/2020 12:44   Anti-infectives (From admission, onward)   None      Assessment/Plan Hx HTN Hx of gout  RUQ abdominal pain Cholelithiasis - This is a 50 y.o. female with 2 weeks of intermittent RUQ abdominal pain that radiates to the right flank. Workup significant for cholelithiasis without evidence of acute cholecystitis on imaging.  Patient however does have an elevated white count at 15.7 with continued right upper quadrant  tenderness despite appropriate IV pain medication.  Will observe overnight and repeat labs in the morning.  Patient could have a early acute cholecystitis that is not current visible on imaging. Will start her on abx to cover this in case. Admit to observation.    FEN - CLD, NPO at midnight VTE - SCDs, Lovenox  ID - Rocephin  Juliet Rude, [facility] 07/02/2020, 2:20 PM Please see Amion for pager number during day hours 7:00am-4:30pm

## 2020-07-03 ENCOUNTER — Observation Stay (HOSPITAL_COMMUNITY): Payer: Managed Care, Other (non HMO)

## 2020-07-03 ENCOUNTER — Encounter (HOSPITAL_COMMUNITY)
Admission: EM | Disposition: A | Discharge status: Home / Self Care | Payer: Self-pay | Source: Home / Self Care | Attending: Emergency Medicine

## 2020-07-03 ENCOUNTER — Encounter (HOSPITAL_COMMUNITY): Payer: Self-pay

## 2020-07-03 HISTORY — PX: CHOLECYSTECTOMY: SHX55

## 2020-07-03 LAB — CBC
HCT: 40.6 % (ref 36.0–46.0)
Hemoglobin: 12.8 g/dL (ref 12.0–15.0)
MCH: 29.7 pg (ref 26.0–34.0)
MCHC: 31.5 g/dL (ref 30.0–36.0)
MCV: 94.2 fL (ref 80.0–100.0)
Platelets: 366 10*3/uL (ref 150–400)
RBC: 4.31 MIL/uL (ref 3.87–5.11)
RDW: 14.2 % (ref 11.5–15.5)
WBC: 14 10*3/uL — ABNORMAL HIGH (ref 4.0–10.5)
nRBC: 0 % (ref 0.0–0.2)

## 2020-07-03 LAB — COMPREHENSIVE METABOLIC PANEL
ALT: 16 U/L (ref 0–44)
AST: 15 U/L (ref 15–41)
Albumin: 3.4 g/dL — ABNORMAL LOW (ref 3.5–5.0)
Alkaline Phosphatase: 84 U/L (ref 38–126)
Anion gap: 11 (ref 5–15)
BUN: 9 mg/dL (ref 6–20)
CO2: 23 mmol/L (ref 22–32)
Calcium: 8.8 mg/dL — ABNORMAL LOW (ref 8.9–10.3)
Chloride: 102 mmol/L (ref 98–111)
Creatinine, Ser: 0.88 mg/dL (ref 0.44–1.00)
GFR, Estimated: >60 mL/min (ref >60)
Glucose, Bld: 107 mg/dL — ABNORMAL HIGH (ref 70–99)
Potassium: 3.3 mmol/L — ABNORMAL LOW (ref 3.5–5.1)
Sodium: 136 mmol/L (ref 135–145)
Total Bilirubin: 0.7 mg/dL (ref 0.3–1.2)
Total Protein: 7.4 g/dL (ref 6.5–8.1)

## 2020-07-03 LAB — SURGICAL PCR SCREEN
MRSA, PCR: NEGATIVE
Staphylococcus aureus: NEGATIVE

## 2020-07-03 SURGERY — LAPAROSCOPIC CHOLECYSTECTOMY
Anesthesia: General | Site: Abdomen

## 2020-07-03 MED ORDER — FENTANYL CITRATE (PF) 100 MCG/2ML IJ SOLN
25.0000 ug | INTRAMUSCULAR | Status: DC | PRN
  Administered 2020-07-03: 50 ug via INTRAVENOUS

## 2020-07-03 MED ORDER — MIDAZOLAM HCL 2 MG/2ML IJ SOLN
INTRAMUSCULAR | Status: AC
  Filled 2020-07-03: qty 2

## 2020-07-03 MED ORDER — PROPOFOL 10 MG/ML IV BOLUS
INTRAVENOUS | Status: AC
  Filled 2020-07-03: qty 40

## 2020-07-03 MED ORDER — KETOROLAC TROMETHAMINE 15 MG/ML IJ SOLN
15.0000 mg | INTRAMUSCULAR | Status: DC
  Filled 2020-07-03: qty 1

## 2020-07-03 MED ORDER — FENTANYL CITRATE (PF) 100 MCG/2ML IJ SOLN
INTRAMUSCULAR | Status: DC | PRN
  Administered 2020-07-03 (×2): 50 ug via INTRAVENOUS

## 2020-07-03 MED ORDER — ACETAMINOPHEN 500 MG PO TABS
1000.0000 mg | ORAL_TABLET | ORAL | Status: AC
  Administered 2020-07-03: 1000 mg via ORAL
  Filled 2020-07-03: qty 2

## 2020-07-03 MED ORDER — LACTATED RINGERS IV SOLN: INTRAVENOUS | Status: DC | PRN

## 2020-07-03 MED ORDER — ROCURONIUM BROMIDE 10 MG/ML (PF) SYRINGE
PREFILLED_SYRINGE | INTRAVENOUS | Status: DC | PRN
  Administered 2020-07-03: 100 mg via INTRAVENOUS

## 2020-07-03 MED ORDER — PROPOFOL 10 MG/ML IV BOLUS
INTRAVENOUS | Status: DC | PRN
  Administered 2020-07-03: 200 mg via INTRAVENOUS

## 2020-07-03 MED ORDER — DEXAMETHASONE SODIUM PHOSPHATE 10 MG/ML IJ SOLN
INTRAMUSCULAR | Status: DC | PRN
  Administered 2020-07-03: 10 mg via INTRAVENOUS

## 2020-07-03 MED ORDER — BUPIVACAINE HCL (PF) 0.5 % IJ SOLN
INTRAMUSCULAR | Status: AC
  Filled 2020-07-03: qty 30

## 2020-07-03 MED ORDER — FENTANYL CITRATE (PF) 100 MCG/2ML IJ SOLN
INTRAMUSCULAR | Status: AC
  Filled 2020-07-03: qty 2

## 2020-07-03 MED ORDER — BUPIVACAINE HCL (PF) 0.5 % IJ SOLN
INTRAMUSCULAR | Status: DC | PRN
  Administered 2020-07-03: 20 mL

## 2020-07-03 MED ORDER — SUGAMMADEX SODIUM 500 MG/5ML IV SOLN
INTRAVENOUS | Status: AC
  Filled 2020-07-03: qty 5

## 2020-07-03 MED ORDER — MORPHINE SULFATE (PF) 2 MG/ML IV SOLN: 2.0000 mg | INTRAVENOUS | Status: DC | PRN

## 2020-07-03 MED ORDER — SCOPOLAMINE 1 MG/3DAYS TD PT72
1.0000 | MEDICATED_PATCH | TRANSDERMAL | Status: DC
  Administered 2020-07-03: 1.5 mg via TRANSDERMAL
  Filled 2020-07-03: qty 1

## 2020-07-03 MED ORDER — FENTANYL CITRATE (PF) 100 MCG/2ML IJ SOLN
INTRAMUSCULAR | Status: AC
  Administered 2020-07-03: 50 ug via INTRAVENOUS
  Filled 2020-07-03: qty 2

## 2020-07-03 MED ORDER — SUGAMMADEX SODIUM 200 MG/2ML IV SOLN
INTRAVENOUS | Status: DC | PRN
  Administered 2020-07-03: 300 mg via INTRAVENOUS

## 2020-07-03 MED ORDER — MIDAZOLAM HCL 2 MG/2ML IJ SOLN
INTRAMUSCULAR | Status: DC | PRN
  Administered 2020-07-03: 2 mg via INTRAVENOUS

## 2020-07-03 MED ORDER — LACTATED RINGERS IR SOLN
Status: DC | PRN
  Administered 2020-07-03: 1000 mL

## 2020-07-03 MED ORDER — LIDOCAINE 2% (20 MG/ML) 5 ML SYRINGE
INTRAMUSCULAR | Status: DC | PRN
  Administered 2020-07-03: 80 mg via INTRAVENOUS

## 2020-07-03 MED ORDER — ONDANSETRON HCL 4 MG/2ML IJ SOLN
INTRAMUSCULAR | Status: DC | PRN
  Administered 2020-07-03: 4 mg via INTRAVENOUS

## 2020-07-03 MED ORDER — 0.9 % SODIUM CHLORIDE (POUR BTL) OPTIME
TOPICAL | Status: DC | PRN
  Administered 2020-07-03: 1000 mL

## 2020-07-03 MED ORDER — GABAPENTIN 300 MG PO CAPS
300.0000 mg | ORAL_CAPSULE | ORAL | Status: AC
  Administered 2020-07-03: 300 mg via ORAL
  Filled 2020-07-03: qty 1

## 2020-07-03 SURGICAL SUPPLY — 34 items
ADH SKN CLS APL DERMABOND .7 (GAUZE/BANDAGES/DRESSINGS) ×1
APL PRP STRL LF DISP 70% ISPRP (MISCELLANEOUS) ×1
APPLIER CLIP 5 13 M/L LIGAMAX5 (MISCELLANEOUS) ×2
APR CLP MED LRG 5 ANG JAW (MISCELLANEOUS) ×1
BAG SPEC RTRVL LRG 6X4 10 (ENDOMECHANICALS) ×1
CABLE HIGH FREQUENCY MONO STRZ (ELECTRODE) ×2 IMPLANT
CHLORAPREP W/TINT 26 (MISCELLANEOUS) ×2 IMPLANT
CLIP APPLIE 5 13 M/L LIGAMAX5 (MISCELLANEOUS) ×1 IMPLANT
COVER MAYO STAND STRL (DRAPES) IMPLANT
COVER WAND RF STERILE (DRAPES) IMPLANT
DECANTER SPIKE VIAL GLASS SM (MISCELLANEOUS) ×2 IMPLANT
DERMABOND ADVANCED (GAUZE/BANDAGES/DRESSINGS) ×1
DERMABOND ADVANCED .7 DNX12 (GAUZE/BANDAGES/DRESSINGS) ×1 IMPLANT
DRAPE C-ARM 42X120 X-RAY (DRAPES) IMPLANT
ELECT REM PT RETURN 15FT ADLT (MISCELLANEOUS) ×2 IMPLANT
GLOVE BIO SURGEON STRL SZ7.5 (GLOVE) ×2 IMPLANT
GOWN STRL REUS W/TWL XL LVL3 (GOWN DISPOSABLE) ×4 IMPLANT
HEMOSTAT SURGICEL 4X8 (HEMOSTASIS) IMPLANT
KIT BASIN OR (CUSTOM PROCEDURE TRAY) ×2 IMPLANT
KIT TURNOVER KIT A (KITS) ×1 IMPLANT
MAT PREVALON FULL STRYKER (MISCELLANEOUS) ×1 IMPLANT
POUCH SPECIMEN RETRIEVAL 10MM (ENDOMECHANICALS) ×2 IMPLANT
SCISSORS LAP 5X35 DISP (ENDOMECHANICALS) ×2 IMPLANT
SET CHOLANGIOGRAPH MIX (MISCELLANEOUS) IMPLANT
SET IRRIG TUBING LAPAROSCOPIC (IRRIGATION / IRRIGATOR) ×2 IMPLANT
SET TUBE SMOKE EVAC HIGH FLOW (TUBING) ×2 IMPLANT
SLEEVE XCEL OPT CAN 5 100 (ENDOMECHANICALS) ×4 IMPLANT
SUT MNCRL AB 4-0 PS2 18 (SUTURE) ×3 IMPLANT
SUT VICRYL 0 UR6 27IN ABS (SUTURE) ×1 IMPLANT
TOWEL OR 17X26 10 PK STRL BLUE (TOWEL DISPOSABLE) ×2 IMPLANT
TOWEL OR NON WOVEN STRL DISP B (DISPOSABLE) ×2 IMPLANT
TRAY LAPAROSCOPIC (CUSTOM PROCEDURE TRAY) ×2 IMPLANT
TROCAR BLADELESS OPT 5 100 (ENDOMECHANICALS) ×2 IMPLANT
TROCAR XCEL BLUNT TIP 100MML (ENDOMECHANICALS) ×2 IMPLANT

## 2020-07-03 NOTE — Anesthesia Procedure Notes (Addendum)
Procedure Name: Intubation Date/Time: 07/03/2020 2:28 PM Performed by: Florene Route, CRNA Patient Re-evaluated:Patient Re-evaluated prior to induction Oxygen Delivery Method: Circle system utilized Preoxygenation: Pre-oxygenation with 100% oxygen Induction Type: IV induction Ventilation: Mask ventilation without difficulty and Oral airway inserted - appropriate to patient size Laryngoscope Size: Hyacinth Meeker and 2 Grade View: Grade I Tube type: Oral Tube size: 7.5 mm Number of attempts: 1 Airway Equipment and Method: Stylet and Patient positioned with wedge pillow Placement Confirmation: ETT inserted through vocal cords under direct vision,  positive ETCO2 and breath sounds checked- equal and bilateral Secured at: 20 cm Tube secured with: Tape Dental Injury: Teeth and Oropharynx as per pre-operative assessment

## 2020-07-03 NOTE — Anesthesia Preprocedure Evaluation (Addendum)
Anesthesia Evaluation    Reviewed: Allergy & Precautions, Patient's Chart, lab work & pertinent test results  Airway Mallampati: II  TM Distance: >3 FB Neck ROM: Full    Dental no notable dental hx.    Pulmonary neg pulmonary ROS,    Pulmonary exam normal breath sounds clear to auscultation       Cardiovascular hypertension, Pt. on medications negative cardio ROS Normal cardiovascular exam Rhythm:Regular Rate:Normal     Neuro/Psych negative neurological ROS  negative psych ROS   GI/Hepatic negative GI ROS, Neg liver ROS,   Endo/Other  Morbid obesity (BMI 58)  Renal/GU negative Renal ROS  negative genitourinary   Musculoskeletal negative musculoskeletal ROS (+)   Abdominal   Peds  Hematology negative hematology ROS (+)   Anesthesia Other Findings   Reproductive/Obstetrics                            Anesthesia Physical Anesthesia Plan  ASA: III  Anesthesia Plan: General   Post-op Pain Management:    Induction: Intravenous  PONV Risk Score and Plan: 3 and Midazolam, Dexamethasone and Ondansetron  Airway Management Planned: Oral ETT  Additional Equipment:   Intra-op Plan:   Post-operative Plan: Extubation in OR  Informed Consent: I have reviewed the patients History and Physical, chart, labs and discussed the procedure including the risks, benefits and alternatives for the proposed anesthesia with the patient or authorized representative who has indicated his/her understanding and acceptance.     Dental advisory given  Plan Discussed with: CRNA  Anesthesia Plan Comments:         Anesthesia Quick Evaluation

## 2020-07-03 NOTE — Op Note (Signed)
Laparoscopic Cholecystectomy Procedure Note  Indications: This patient presents with symptomatic gallbladder disease and will undergo laparoscopic cholecystectomy.  Pre-operative Diagnosis: acute cholecystitis with cholelithiasis  Post-operative Diagnosis: Same  Surgeon: Abigail Miyamoto   Assistants: Leary Roca, PA  Anesthesia: General endotracheal anesthesia  ASA Class: 2  Procedure Details  The patient was seen again in the Holding Room. The risks, benefits, complications, treatment options, and expected outcomes were discussed with the patient. The possibilities of reaction to medication, pulmonary aspiration, perforation of viscus, bleeding, recurrent infection, finding a normal gallbladder, the need for additional procedures, failure to diagnose a condition, the possible need to convert to an open procedure, and creating a complication requiring transfusion or operation were discussed with the patient. The likelihood of improving the patient's symptoms with return to their baseline status is good.  The patient and/or family concurred with the proposed plan, giving informed consent. The site of surgery properly noted. The patient was taken to Operating Room, identified as Jessica Schaefer and the procedure verified as Laparoscopic Cholecystectomy with Intraoperative Cholangiogram. A Time Out was held and the above information confirmed.  Prior to the induction of general anesthesia, antibiotic prophylaxis was administered. General endotracheal anesthesia was then administered and tolerated well. After the induction, the abdomen was prepped with Chloraprep and draped in sterile fashion. The patient was positioned in the supine position.  Local anesthetic agent was injected into the skin near the umbilicus and an incision made. We dissected down to the abdominal fascia with blunt dissection.  The fascia was incised vertically and we entered the peritoneal cavity bluntly.  A pursestring  suture of 0-Vicryl was placed around the fascial opening.  The Hasson cannula was inserted and secured with the stay suture.  Pneumoperitoneum was then created with CO2 and tolerated well without any adverse changes in the patient's vital signs. A 5-mm port was placed in the subxiphoid position.  Two 5-mm ports were placed in the right upper quadrant. All skin incisions were infiltrated with a local anesthetic agent before making the incision and placing the trocars.   We positioned the patient in reverse Trendelenburg, tilted slightly to the patient's left.  The gallbladder was identified, the fundus grasped and retracted cephalad. Adhesions were lysed bluntly and with the electrocautery where indicated, taking care not to injure any adjacent organs or viscus. The infundibulum was grasped and retracted laterally, exposing the peritoneum overlying the triangle of Calot. This was then divided and exposed in a blunt fashion. The cystic duct was clearly identified and bluntly dissected circumferentially. A critical view of the cystic duct and cystic artery was obtained.  The cystic duct was then ligated with clips and divided. The cystic artery was, dissected free, ligated with clips and divided as well.   The gallbladder was dissected from the liver bed in retrograde fashion with the electrocautery. The gallbladder was removed and placed in an Endocatch sac. The liver bed was irrigated and inspected. Hemostasis was achieved with the electrocautery. Copious irrigation was utilized and was repeatedly aspirated until clear.  The gallbladder and Endocatch sac were then removed through the umbilical port site.  The pursestring suture was used to close the umbilical fascia.  I had to place another 0 Vicryl suture at the umbilical fascia in a figure of eight fashion to close the fascia.  We again inspected the right upper quadrant for hemostasis.  Pneumoperitoneum was released as we removed the trocars.  4-0 Monocryl  was used to close the skin.  Skin glue was then applied. The patient was then extubated and brought to the recovery room in stable condition. Instrument, sponge, and needle counts were correct at closure and at the conclusion of the case.   Findings: Cholecystitis with Cholelithiasis  Estimated Blood Loss: Minimal         Drains: 0         Specimens: Gallbladder           Complications: None; patient tolerated the procedure well.         Disposition: PACU - hemodynamically stable.         Condition: stable

## 2020-07-03 NOTE — Anesthesia Postprocedure Evaluation (Signed)
Anesthesia Post Note  Patient: Jessica Schaefer  Procedure(s) Performed: LAPAROSCOPIC CHOLECYSTECTOMY (N/A Abdomen)     Patient location during evaluation: PACU Anesthesia Type: General Level of consciousness: awake and alert Pain management: pain level controlled Vital Signs Assessment: post-procedure vital signs reviewed and stable Respiratory status: spontaneous breathing, nonlabored ventilation and respiratory function stable Cardiovascular status: blood pressure returned to baseline and stable Postop Assessment: no apparent nausea or vomiting Anesthetic complications: no   No complications documented.  Last Vitals:  Vitals:   07/03/20 1630 07/03/20 1651  BP: (!) 124/91 (!) 133/91  Pulse: 89 90  Resp: (!) 21 18  Temp: 37.1 C 36.7 C  SpO2: 95% 96%    Last Pain:  Vitals:   07/03/20 1651  TempSrc: Oral  PainSc: 5                  Lowella Curb

## 2020-07-03 NOTE — Progress Notes (Signed)
Progress Note     Subjective: Patient denies n/v overnight and reports pain is still present but seems to be improving. WBC still elevated and patient reports she would like to proceed with surgery so she doesn't continue to have these symptoms.   Objective: Vital signs in last 24 hours: Temp:  [97.4 F (36.3 C)-98.6 F (37 C)] 98.2 F (36.8 C) (01/13 0555) Pulse Rate:  [94-118] 97 (01/13 0555) Resp:  [15-29] 18 (01/13 0555) BP: (112-175)/(84-124) 151/91 (01/13 0555) SpO2:  [94 %-100 %] 95 % (01/13 0555) Weight:  [144.4 kg] 144.4 kg (01/12 1712)    Intake/Output from previous day: 01/12 0701 - 01/13 0700 In: 60 [P.O.:60] Out: 0  Intake/Output this shift: No intake/output data recorded.  PE: General: pleasant, WD, obese female who is laying in bed in NAD HEENT: head is normocephalic, atraumatic.  Sclera are anicteric.  PERRL.  Ears and nose without any masses or lesions.  Mouth is pink and moist. Dentition fair Heart: regular, rate, and rhythm.  Normal s1,s2. No obvious murmurs, gallops, or rubs noted.  Palpable pedal pulses bilaterally  Lungs: CTAB, no wheezes, rhonchi, or rales noted.  Respiratory effort nonlabored Abd: Soft, ND, mild tenderness to the RUQ without rebound, rigidity or guarding. Negative Murphy's sign. +BS, no masses, hernias, or organomegaly MS: no BUE/BLE edema, calves soft and nontender Skin: warm and dry with no masses, lesions, or rashes Psych: A&Ox4 with an appropriate affect Neuro: cranial nerves grossly intact, equal strength in BUE/BLE bilaterally, normal speech, thought process intact   Lab Results:  Recent Labs    07/02/20 1416 07/03/20 0552  WBC 15.0* 14.0*  HGB 13.8 12.8  HCT 44.2 40.6  PLT 442* 366   BMET Recent Labs    07/02/20 0846 07/02/20 1416 07/03/20 0552  NA 138  --  136  K 3.2*  --  3.3*  CL 102  --  102  CO2 24  --  23  GLUCOSE 115*  --  107*  BUN 12  --  9  CREATININE 0.72 1.24* 0.88  CALCIUM 9.5  --  8.8*    PT/INR No results for input(s): LABPROT, INR in the last 72 hours. CMP     Component Value Date/Time   NA 136 07/03/2020 0552   NA 143 03/21/2020 1445   K 3.3 (L) 07/03/2020 0552   CL 102 07/03/2020 0552   CO2 23 07/03/2020 0552   GLUCOSE 107 (H) 07/03/2020 0552   BUN 9 07/03/2020 0552   BUN 12 03/21/2020 1445   CREATININE 0.88 07/03/2020 0552   CALCIUM 8.8 (L) 07/03/2020 0552   PROT 7.4 07/03/2020 0552   PROT 7.6 03/21/2020 1445   ALBUMIN 3.4 (L) 07/03/2020 0552   ALBUMIN 4.1 03/21/2020 1445   AST 15 07/03/2020 0552   ALT 16 07/03/2020 0552   ALKPHOS 84 07/03/2020 0552   BILITOT 0.7 07/03/2020 0552   BILITOT 0.2 03/21/2020 1445   GFRNONAA >60 07/03/2020 0552   GFRAA 85 03/21/2020 1445   Lipase     Component Value Date/Time   LIPASE 21 07/02/2020 0846       Studies/Results: CT Renal Stone Study  Result Date: 07/02/2020 CLINICAL DATA:  Right flank pain for 2 weeks. EXAM: CT ABDOMEN AND PELVIS WITHOUT CONTRAST TECHNIQUE: Multidetector CT imaging of the abdomen and pelvis was performed following the standard protocol without IV contrast. COMPARISON:  Pelvic ultrasound 10/10/2009 FINDINGS: Lower chest: Bibasilar scarring. Normal heart size without pericardial or pleural effusion. Hepatobiliary: Moderate  hepatic steatosis and mild hepatomegaly at 18.7 craniocaudal. Stone filled gallbladder, without acute cholecystitis or biliary duct dilatation. Pancreas: Normal, without mass or ductal dilatation. Spleen: Normal in size, without focal abnormality. Adrenals/Urinary Tract: Normal adrenal glands. No renal calculi or hydronephrosis. No hydroureter or ureteric calculi. No bladder calculi. Stomach/Bowel: Normal stomach, without wall thickening. Normal colon, appendix, and terminal ileum. Normal small bowel. Vascular/Lymphatic: Normal caliber of the aorta and branch vessels. No abdominopelvic adenopathy. Reproductive: Normal uterus and adnexa. Other: No significant free fluid.  Mild  pelvic floor laxity. Musculoskeletal: Advanced lumbosacral spondylosis. IMPRESSION: 1. No urinary tract calculi or hydronephrosis. 2. Hepatic steatosis and hepatomegaly. 3. Cholelithiasis. Electronically Signed   By: Jeronimo Greaves M.D.   On: 07/02/2020 11:53   US Abdomen Limited RUQ (LIVER/GB)  Result Date: 07/02/2020 CLINICAL DATA:  Right-sided abdominal pain EXAM: ULTRASOUND ABDOMEN LIMITED RIGHT UPPER QUADRANT COMPARISON:  Same-day CT FINDINGS: Technical note: Examination is limited secondary to poor penetration from patient body habitus. Gallbladder: Gallbladder lumen completely filled with stones with a wall-echo-shadow appearance. No pericholecystic fluid is evident. Sonographer did not report the presence of a Murphy sign. Common bile duct: Diameter: 6 mm. Liver: Limited. No focal lesion identified. Within normal limits in parenchymal echogenicity. Portal vein is patent on color Doppler imaging with normal direction of blood flow towards the liver. Other: None. IMPRESSION: 1. Limited exam.  Liver poorly visualized. 2. Cholelithiasis without sonographic evidence of cholecystitis. Electronically Signed   By: Duanne Guess D.O.   On: 07/02/2020 12:44    Anti-infectives: Anti-infectives (From admission, onward)   Start     Dose/Rate Route Frequency Ordered Stop   07/02/20 1500  cefTRIAXone (ROCEPHIN) 2 g in sodium chloride 0.9 % 100 mL IVPB        2 g 200 mL/hr over 30 Minutes Intravenous Daily 07/02/20 1418         Assessment/Plan Hx HTN Hx of gout  Cholelithiasis Probable Acute cholecystitis  - Imaging yesterday shows cholelithiasis without evidence of cholecystitis, however WBC elevated at 15 on admit and remains 14 today. Patient reports pain is better but still intermittently present. Wishes to proceed with laparoscopic cholecystectomy today. Have discussed risks and benefits of surgery and patient verbalized understanding. Plan for lap chole this afternoon.    FEN - NPO,  IVF VTE - SCDs, Lovenox  ID - Rocephin 1/12>>  LOS: 0 days    Juliet Rude , [facility] 07/03/2020, 8:31 AM Please see Amion for pager number during day hours 7:00am-4:30pm

## 2020-07-03 NOTE — Transfer of Care (Signed)
Immediate Anesthesia Transfer of Care Note  Patient: Jessica Schaefer  Procedure(s) Performed: LAPAROSCOPIC CHOLECYSTECTOMY (N/A Abdomen)  Patient Location: PACU  Anesthesia Type:General  Level of Consciousness: drowsy  Airway & Oxygen Therapy: Patient Spontanous Breathing and Patient connected to face mask oxygen  Post-op Assessment: Report given to RN and Post -op Vital signs reviewed and stable  Post vital signs: Reviewed and stable  Last Vitals:  Vitals Value Taken Time  BP 139/55 07/03/20 1536  Temp    Pulse 94 07/03/20 1539  Resp 18 07/03/20 1539  SpO2 97 % 07/03/20 1539  Vitals shown include unvalidated device data.  Last Pain:  Vitals:   07/03/20 1346  TempSrc:   PainSc: 5       Patients Stated Pain Goal: 2 (07/03/20 0845)  Complications: No complications documented.

## 2020-07-04 ENCOUNTER — Encounter (HOSPITAL_COMMUNITY): Payer: Self-pay | Admitting: Surgery

## 2020-07-04 MED ORDER — OXYCODONE HCL 5 MG PO TABS: 5.0000 mg | ORAL_TABLET | Freq: Four times a day (QID) | ORAL | 0 refills | Status: DC | PRN

## 2020-07-04 MED ORDER — ACETAMINOPHEN 325 MG PO TABS: 650.0000 mg | ORAL_TABLET | Freq: Four times a day (QID) | ORAL | Status: DC | PRN

## 2020-07-04 NOTE — Discharge Instructions (Signed)
[facility] LAPAROSCOPIC SURGERY: POST OP INSTRUCTIONS Always review your discharge instruction sheet given to you by the facility where your surgery was performed. IF YOU HAVE DISABILITY OR FAMILY LEAVE FORMS, YOU MUST BRING THEM TO THE OFFICE FOR PROCESSING.   DO NOT GIVE THEM TO YOUR DOCTOR.  PAIN CONTROL  1. First take acetaminophen (Tylenol) AND/or ibuprofen (Advil) to control your pain after surgery.  Follow directions on package.  Taking acetaminophen (Tylenol) and/or ibuprofen (Advil) regularly after surgery will help to control your pain and lower the amount of prescription pain medication you may need.  You should not take more than 3,000 mg (3 grams) of acetaminophen (Tylenol) in 24 hours.  You should not take ibuprofen (Advil), aleve, motrin, naprosyn or other NSAIDS if you have a history of stomach ulcers or chronic kidney disease.  2. A prescription for pain medication may be given to you upon discharge.  Take your pain medication as prescribed, if you still have uncontrolled pain after taking acetaminophen (Tylenol) or ibuprofen (Advil). 3. Use ice packs to help control pain. 4. If you need a refill on your pain medication, please contact your pharmacy.  They will contact our office to request authorization. Prescriptions will not be filled after 5pm or on week-ends.  HOME MEDICATIONS 5. Take your usually prescribed medications unless otherwise directed.  DIET 6. You should follow a light diet the first few days after arrival home.  Be sure to include lots of fluids daily. Avoid fatty, fried foods.   CONSTIPATION 7. It is common to experience some constipation after surgery and if you are taking pain medication.  Increasing fluid intake and taking a stool softener (such as Colace) will usually help or prevent this problem from occurring.  A mild laxative (Milk of Magnesia or Miralax) should be taken according to package instructions if there are no bowel  movements after 48 hours.  WOUND/INCISION CARE 8. Most patients will experience some swelling and bruising in the area of the incisions.  Ice packs will help.  Swelling and bruising can take several days to resolve.  9. Unless discharge instructions indicate otherwise, follow guidelines below  a. STERI-STRIPS - you may remove your outer bandages 48 hours after surgery, and you may shower at that time.  You have steri-strips (small skin tapes) in place directly over the incision.  These strips should be left on the skin for 7-10 days.   b. DERMABOND/SKIN GLUE - you may shower in 24 hours.  The glue will flake off over the next 2-3 weeks. 10. Any sutures or staples will be removed at the office during your follow-up visit.  ACTIVITIES 11. You may resume regular (light) daily activities beginning the next day--such as daily self-care, walking, climbing stairs--gradually increasing activities as tolerated.  You may have sexual intercourse when it is comfortable.  Refrain from any heavy lifting or straining until approved by your doctor. a. You may drive when you are no longer taking prescription pain medication, you can comfortably wear a seatbelt, and you can safely maneuver your car and apply brakes.  FOLLOW-UP 12. You should see your doctor in the office for a follow-up appointment approximately 2-3 weeks after your surgery.  You should have been given your post-op/follow-up appointment when your surgery was scheduled.  If you did not receive a post-op/follow-up appointment, make sure that you call for this appointment within a day or two after you arrive home to insure a convenient appointment time.     WHEN TO CALL YOUR DOCTOR: 1. Fever over 101.0 2. Inability to urinate 3. Continued bleeding from incision. 4. Increased pain, redness, or drainage from the incision. 5. Increasing abdominal pain  The clinic staff is available to answer your questions during regular business hours.  Please don't  hesitate to call and ask to speak to one of the nurses for clinical concerns.  If you have a medical emergency, go to the nearest emergency room or call 911.  A surgeon from [facility] is always on call at the hospital. [address], [address], [place], [place]  [address] ? [address] [address], [place], [place]   [address] [phone] ? [phone] ? FAX [phone] Web site: [email or web address]  .........   Managing Your Pain After Surgery Without Opioids    Thank you for participating in our program to help patients manage their pain after surgery without opioids. This is part of our effort to provide you with the best care possible, without exposing you or your family to the risk that opioids pose.  What pain can I expect after surgery? You can expect to have some pain after surgery. This is normal. The pain is typically worse the day after surgery, and quickly begins to get better. Many studies have found that many patients are able to manage their pain after surgery with Over-the-Counter (OTC) medications such as Tylenol and Motrin. If you have a condition that does not allow you to take Tylenol or Motrin, notify your surgical team.  How will I manage my pain? The best strategy for controlling your pain after surgery is around the clock pain control with Tylenol (acetaminophen) and Motrin (ibuprofen or Advil). Alternating these medications with each other allows you to maximize your pain control. In addition to Tylenol and Motrin, you can use heating pads or ice packs on your incisions to help reduce your pain.  How will I alternate your regular strength over-the-counter pain medication? You will take a dose of pain medication every three hours. ; Start by taking 650 mg of Tylenol (2 pills of 325 mg) ; 3 hours later take 600 mg of Motrin (3 pills of 200 mg) ; 3 hours after taking the Motrin take 650 mg of Tylenol ; 3 hours after that take 600 mg of  Motrin.   - 1 -  See example - if your first dose of Tylenol is at 12:00 PM   12:00 PM Tylenol 650 mg (2 pills of 325 mg)  3:00 PM Motrin 600 mg (3 pills of 200 mg)  6:00 PM Tylenol 650 mg (2 pills of 325 mg)  9:00 PM Motrin 600 mg (3 pills of 200 mg)  Continue alternating every 3 hours   We recommend that you follow this schedule around-the-clock for at least 3 days after surgery, or until you feel that it is no longer needed. Use the table on the last page of this handout to keep track of the medications you are taking. Important: Do not take more than 3000mg of Tylenol or 3200mg of Motrin in a 24-hour period. Do not take ibuprofen/Motrin if you have a history of bleeding stomach ulcers, severe kidney disease, &/or actively taking a blood thinner  What if I still have pain? If you have pain that is not controlled with the over-the-counter pain medications (Tylenol and Motrin or Advil) you might have what we call "breakthrough" pain. You will receive a prescription for a small amount of an opioid pain medication such as   Oxycodone, Tramadol, or Tylenol with Codeine. Use these opioid pills in the first 24 hours after surgery if you have breakthrough pain. Do not take more than 1 pill every 4-6 hours.  If you still have uncontrolled pain after using all opioid pills, don't hesitate to call our staff using the number provided. We will help make sure you are managing your pain in the best way possible, and if necessary, we can provide a prescription for additional pain medication.   Day 1    Time  Name of Medication Number of pills taken  Amount of Acetaminophen  Pain Level   Comments  AM PM       AM PM       AM PM       AM PM       AM PM       AM PM       AM PM       AM PM       Total Daily amount of Acetaminophen Do not take more than  3,000 mg per day      Day 2    Time  Name of Medication Number of pills taken  Amount of Acetaminophen  Pain Level   Comments  AM  PM       AM PM       AM PM       AM PM       AM PM       AM PM       AM PM       AM PM       Total Daily amount of Acetaminophen Do not take more than  3,000 mg per day      Day 3    Time  Name of Medication Number of pills taken  Amount of Acetaminophen  Pain Level   Comments  AM PM       AM PM       AM PM       AM PM          AM PM       AM PM       AM PM       AM PM       Total Daily amount of Acetaminophen Do not take more than  3,000 mg per day      Day 4    Time  Name of Medication Number of pills taken  Amount of Acetaminophen  Pain Level   Comments  AM PM       AM PM       AM PM       AM PM       AM PM       AM PM       AM PM       AM PM       Total Daily amount of Acetaminophen Do not take more than  3,000 mg per day      Day 5    Time  Name of Medication Number of pills taken  Amount of Acetaminophen  Pain Level   Comments  AM PM       AM PM       AM PM       AM PM       AM PM       AM PM       AM PM         AM PM       Total Daily amount of Acetaminophen Do not take more than  3,000 mg per day       Day 6    Time  Name of Medication Number of pills taken  Amount of Acetaminophen  Pain Level  Comments  AM PM       AM PM       AM PM       AM PM       AM PM       AM PM       AM PM       AM PM       Total Daily amount of Acetaminophen Do not take more than  3,000 mg per day      Day 7    Time  Name of Medication Number of pills taken  Amount of Acetaminophen  Pain Level   Comments  AM PM       AM PM       AM PM       AM PM       AM PM       AM PM       AM PM       AM PM       Total Daily amount of Acetaminophen Do not take more than  3,000 mg per day        For additional information about how and where to safely dispose of unused opioid medications - https://www.morepowerful[employer].org  Disclaimer: This document contains information and/or instructional materials adapted from [facility]  for the typical patient with your condition. It does not replace medical advice from your health care provider because your experience may differ from that of the typical patient. Talk to your health care provider if you have any questions about this document, your condition or your treatment plan. Adapted from [facility]  

## 2020-07-04 NOTE — Discharge Summary (Signed)
[facility] Discharge Summary   Patient ID: Jessica Schaefer MRN: [id number] DOB/AGE: [birth year] 50 y.o.  Admit date: 07/02/2020 Discharge date: 07/04/2020  Admitting Diagnosis: Acute cholecystitis   Discharge Diagnosis Patient Active Problem List   Diagnosis Date Noted  . Acute cholecystitis 07/02/2020    Consultants None   Imaging: CT Renal Stone Study  Result Date: 07/02/2020 CLINICAL DATA:  Right flank pain for 2 weeks. EXAM: CT ABDOMEN AND PELVIS WITHOUT CONTRAST TECHNIQUE: Multidetector CT imaging of the abdomen and pelvis was performed following the standard protocol without IV contrast. COMPARISON:  Pelvic ultrasound 10/10/2009 FINDINGS: Lower chest: Bibasilar scarring. Normal heart size without pericardial or pleural effusion. Hepatobiliary: Moderate hepatic steatosis and mild hepatomegaly at 18.7 craniocaudal. Stone filled gallbladder, without acute cholecystitis or biliary duct dilatation. Pancreas: Normal, without mass or ductal dilatation. Spleen: Normal in size, without focal abnormality. Adrenals/Urinary Tract: Normal adrenal glands. No renal calculi or hydronephrosis. No hydroureter or ureteric calculi. No bladder calculi. Stomach/Bowel: Normal stomach, without wall thickening. Normal colon, appendix, and terminal ileum. Normal small bowel. Vascular/Lymphatic: Normal caliber of the aorta and branch vessels. No abdominopelvic adenopathy. Reproductive: Normal uterus and adnexa. Other: No significant free fluid.  Mild pelvic floor laxity. Musculoskeletal: Advanced lumbosacral spondylosis. IMPRESSION: 1. No urinary tract calculi or hydronephrosis. 2. Hepatic steatosis and hepatomegaly. 3. Cholelithiasis. Electronically Signed   By: Jeronimo Greaves M.D.   On: 07/02/2020 11:53   US Abdomen Limited RUQ (LIVER/GB)  Result Date: 07/02/2020 CLINICAL DATA:  Right-sided abdominal pain EXAM: ULTRASOUND ABDOMEN LIMITED RIGHT UPPER QUADRANT COMPARISON:  Same-day CT FINDINGS:  Technical note: Examination is limited secondary to poor penetration from patient body habitus. Gallbladder: Gallbladder lumen completely filled with stones with a wall-echo-shadow appearance. No pericholecystic fluid is evident. Sonographer did not report the presence of a Murphy sign. Common bile duct: Diameter: 6 mm. Liver: Limited. No focal lesion identified. Within normal limits in parenchymal echogenicity. Portal vein is patent on color Doppler imaging with normal direction of blood flow towards the liver. Other: None. IMPRESSION: 1. Limited exam.  Liver poorly visualized. 2. Cholelithiasis without sonographic evidence of cholecystitis. Electronically Signed   By: Duanne Guess D.O.   On: 07/02/2020 12:44    Procedures Dr. Carman Ching (07/03/20) - Laparoscopic Cholecystectomy    Hospital Course:  Patient is a 50 year old female who presented to [facility] with abdominal pain.  Workup showed cholelithiasis with probable cholecystitis.  Patient was admitted and underwent procedure listed above.  Tolerated procedure well and was transferred to the floor.  Diet was advanced as tolerated.  On POD#1, the patient was voiding well, tolerating diet, ambulating well, pain well controlled, vital signs stable, incisions c/d/i and felt stable for discharge home.  Patient will follow up in our office in 3-4 weeks and knows to call with questions or concerns. She will call to confirm appointment date/time.    Physical Exam: General:  Alert, NAD, pleasant, comfortable Abd:  Soft, ND, mild tenderness, incisions C/D/I   I or a member of my team have reviewed this patient in the Controlled Substance Database.   Allergies as of 07/04/2020   No Known Allergies     Medication List    STOP taking these medications   GOODY HEADACHE PO     TAKE these medications   acetaminophen 325 MG tablet Commonly known as: TYLENOL Take 2 tablets (650 mg total) by mouth every 6 (six) hours as needed for mild pain (or temp  > 100).  allopurinol 100 MG tablet Commonly known as: ZYLOPRIM Take 1 tablet (100 mg total) by mouth daily.   hydrochlorothiazide 25 MG tablet Commonly known as: HYDRODIURIL Take 1 tablet (25 mg total) by mouth daily.   ibuprofen 800 MG tablet Commonly known as: ADVIL Take 800 mg by mouth every 8 (eight) hours as needed for fever, headache or mild pain.   lisinopril 20 MG tablet Commonly known as: ZESTRIL Take 1 tablet (20 mg total) by mouth daily.   metFORMIN 500 MG tablet Commonly known as: GLUCOPHAGE Take 1 tablet (500 mg total) by mouth 2 (two) times daily with a meal.   oxyCODONE 5 MG immediate release tablet Commonly known as: Oxy IR/ROXICODONE Take 1 tablet (5 mg total) by mouth every 6 (six) hours as needed for moderate pain or severe pain.   Vitamin D (Ergocalciferol) 1.25 MG (50000 UNIT) Caps capsule Commonly known as: DRISDOL Take 1 capsule (50,000 Units total) by mouth every 7 (seven) days.         Follow-up Information    [facility]. Call.   Specialty: General Surgery Why: Please call to confirm appointment date/time. Bring photo ID and insurance information with you. Please arrive 30 min prior to appointment time for check-in.  Contact information: [address] [place] [address] [phone]               Signed: Juliet Rude , [facility] 07/04/2020, 8:09 AM Please see Amion for pager number during day hours 7:00am-4:30pm

## 2020-07-07 LAB — SURGICAL PATHOLOGY

## 2020-07-23 ENCOUNTER — Ambulatory Visit: Payer: Managed Care, Other (non HMO) | Admitting: Family Medicine

## 2020-09-23 ENCOUNTER — Other Ambulatory Visit: Payer: Self-pay | Admitting: Family Medicine

## 2020-09-23 DIAGNOSIS — R634 Abnormal weight loss: Secondary | ICD-10-CM

## 2020-10-11 ENCOUNTER — Other Ambulatory Visit: Payer: Self-pay | Admitting: Family Medicine

## 2020-10-11 DIAGNOSIS — E559 Vitamin D deficiency, unspecified: Secondary | ICD-10-CM

## 2021-05-05 ENCOUNTER — Encounter: Payer: Self-pay | Admitting: Nurse Practitioner

## 2021-05-05 ENCOUNTER — Other Ambulatory Visit: Payer: Self-pay

## 2021-05-05 ENCOUNTER — Ambulatory Visit: Payer: Managed Care, Other (non HMO) | Admitting: Nurse Practitioner

## 2021-05-05 VITALS — BP 151/106 | HR 101 | Temp 97.5°F | Ht 63.0 in | Wt 307.0 lb

## 2021-05-05 DIAGNOSIS — T7840XA Allergy, unspecified, initial encounter: Secondary | ICD-10-CM | POA: Diagnosis not present

## 2021-05-05 DIAGNOSIS — I10 Essential (primary) hypertension: Secondary | ICD-10-CM | POA: Diagnosis not present

## 2021-05-05 DIAGNOSIS — M109 Gout, unspecified: Secondary | ICD-10-CM | POA: Diagnosis not present

## 2021-05-05 DIAGNOSIS — Z0000 Encounter for general adult medical examination without abnormal findings: Secondary | ICD-10-CM | POA: Diagnosis not present

## 2021-05-05 LAB — POCT URINALYSIS DIP (CLINITEK)
Bilirubin, UA: NEGATIVE
Blood, UA: NEGATIVE
Glucose, UA: NEGATIVE mg/dL
Ketones, POC UA: NEGATIVE mg/dL
Nitrite, UA: NEGATIVE
Spec Grav, UA: 1.025 (ref 1.010–1.025)
Urobilinogen, UA: 0.2 E.U./dL
pH, UA: 5.5 (ref 5.0–8.0)

## 2021-05-05 MED ORDER — ALLOPURINOL 100 MG PO TABS: 100.0000 mg | ORAL_TABLET | Freq: Every day | ORAL | 3 refills | Status: DC

## 2021-05-05 MED ORDER — LISINOPRIL 20 MG PO TABS: 20.0000 mg | ORAL_TABLET | Freq: Every day | ORAL | 3 refills | Status: DC

## 2021-05-05 MED ORDER — HYDROCHLOROTHIAZIDE 25 MG PO TABS: 25.0000 mg | ORAL_TABLET | Freq: Every day | ORAL | 3 refills | Status: DC

## 2021-05-05 NOTE — Patient Instructions (Signed)
You were seen today in the [facility] for follow up of chronic illness and medication refill. Labs were collected, results will be available via MyChart or, if abnormal, you will be contacted by clinic staff. You were prescribed medications, please take as directed. Please follow up in B/P and pap smear for reevaluation.

## 2021-05-05 NOTE — Progress Notes (Signed)
[facility] Patient [facility] [address], [place]  [address] Phone:  [phone]   Fax:  [phone] Subjective:   Patient ID: Jessica Schaefer, female    DOB: 03/08/1971, 50 y.o.   MRN: [id number]  Chief Complaint  Patient presents with   Follow-up    Requesting medication refills.  Patient stated she breakouts with certain foods she is unable to determine which ones, considering referral to allergist.    HPI Jessica Schaefer 50 y.o. female  has a past medical history of Anemia (1995), Gout, Hypertension, and Vitamin D deficiency (03/2020). To the [facility] for medication refill and referral to allergy specialist.   Patient states that she has been compliant with all medications. Last dose of B/P medication yesterday, states that she forgot to take today's dosage. Denies adhering to any specific diet/ exercise regimen. Her goal is to work towards losing weight. Was placed on metformin in the past, with no desired weight loss. Stopped taking metformin several months ago. Denies monitoring B/P at home.   Patient also requesting referral to allergy specialist. States that over the past several weeks, she has noted itching with consumption of certain foods, including tuna and alcohol. Suspects that she has additional allergies and needs allergy testing. Denies any other complaints today. Denies any itching and/ other allergy symptoms during visit.   Denies any fatigue, chest pain, shortness of breath, HA or dizziness. Denies any blurred vision, numbness or tingling.  Past Medical History:  Diagnosis Date   Anemia 1995   Gout    Hypertension    Vitamin D deficiency 03/2020    Past Surgical History:  Procedure Laterality Date   BREAST SURGERY     REDUCTION; 1997-1998   CHOLECYSTECTOMY N/A 07/03/2020   Procedure: LAPAROSCOPIC CHOLECYSTECTOMY;  Surgeon: Abigail Miyamoto, MD;  Location: [facility]S;  Service: General;  Laterality: N/A;    Family History  Problem Relation  Age of Onset   Early death Mother    Hypertension Mother    Obesity Mother     Social History   Socioeconomic History   Marital status: Single    Spouse name: Not on file   Number of children: Not on file   Years of education: Not on file   Highest education level: Not on file  Occupational History   Not on file  Tobacco Use   Smoking status: Never   Smokeless tobacco: Never  Vaping Use   Vaping Use: Not on file  Substance and Sexual Activity   Alcohol use: No   Drug use: No   Sexual activity: Not Currently  Other Topics Concern   Not on file  Social History Narrative   Not on file   Social Determinants of Health   Financial Resource Strain: Not on file  Food Insecurity: Not on file  Transportation Needs: Not on file  Physical Activity: Not on file  Stress: Not on file  Social Connections: Not on file  Intimate Partner Violence: Not on file    Outpatient Medications Prior to Visit  Medication Sig Dispense Refill   allopurinol (ZYLOPRIM) 100 MG tablet Take 1 tablet (100 mg total) by mouth daily. 90 tablet 3   hydrochlorothiazide (HYDRODIURIL) 25 MG tablet Take 1 tablet (25 mg total) by mouth daily. 90 tablet 3   lisinopril (ZESTRIL) 20 MG tablet Take 1 tablet (20 mg total) by mouth daily. 90 tablet 3   metFORMIN (GLUCOPHAGE) 500 MG tablet TAKE 1 TABLET(500 MG) BY MOUTH  TWICE DAILY WITH A MEAL 60 tablet 1   acetaminophen (TYLENOL) 325 MG tablet Take 2 tablets (650 mg total) by mouth every 6 (six) hours as needed for mild pain (or temp > 100).     ibuprofen (ADVIL) 800 MG tablet Take 800 mg by mouth every 8 (eight) hours as needed for fever, headache or mild pain.     oxyCODONE (OXY IR/ROXICODONE) 5 MG immediate release tablet Take 1 tablet (5 mg total) by mouth every 6 (six) hours as needed for moderate pain or severe pain. 15 tablet 0   Vitamin D, Ergocalciferol, (DRISDOL) 1.25 MG (50000 UNIT) CAPS capsule Take 1 capsule (50,000 Units total) by mouth every 7 (seven)  days. 5 capsule 6   No facility-administered medications prior to visit.    No Known Allergies  Review of Systems  Constitutional:  Negative for chills, fever and malaise/fatigue.  HENT: Negative.    Eyes: Negative.   Respiratory:  Negative for cough and shortness of breath.   Cardiovascular:  Negative for chest pain, palpitations and leg swelling.  Gastrointestinal:  Negative for abdominal pain, blood in stool, constipation, diarrhea, nausea and vomiting.  Genitourinary: Negative.   Musculoskeletal: Negative.   Skin: Negative.   Neurological: Negative.   Psychiatric/Behavioral:  Negative for depression. The patient is not nervous/anxious.   All other systems reviewed and are negative.     Objective:    Physical Exam Vitals reviewed.  Constitutional:      General: She is not in acute distress.    Appearance: Normal appearance.  HENT:     Head: Normocephalic.     Right Ear: Tympanic membrane and ear canal normal.     Left Ear: Tympanic membrane and ear canal normal.     Nose: Nose normal.     Mouth/Throat:     Mouth: Mucous membranes are moist.     Pharynx: Oropharynx is clear.  Eyes:     Extraocular Movements: Extraocular movements intact.     Conjunctiva/sclera: Conjunctivae normal.     Pupils: Pupils are equal, round, and reactive to light.  Cardiovascular:     Rate and Rhythm: Normal rate and regular rhythm.     Pulses: Normal pulses.     Heart sounds: Normal heart sounds.     Comments: No obvious peripheral edema Pulmonary:     Effort: Pulmonary effort is normal.     Breath sounds: Normal breath sounds.  Musculoskeletal:        General: Normal range of motion.     Cervical back: Normal range of motion and neck supple.  Skin:    General: Skin is warm and dry.     Capillary Refill: Capillary refill takes less than 2 seconds.  Neurological:     General: No focal deficit present.     Mental Status: She is alert and oriented to person, place, and time.   Psychiatric:        Mood and Affect: Mood normal.        Behavior: Behavior normal.        Thought Content: Thought content normal.        Judgment: Judgment normal.    BP (!) 151/106 (BP Location: Left Arm, Patient Position: Sitting)   Pulse (!) 101   Temp (!) 97.5 F (36.4 C)   Ht 5\' 3"  (1.6 m)   Wt (!) 307 lb 0.2 oz (139.3 kg)   SpO2 99%   BMI 54.38 kg/m  Wt Readings from Last 3 Encounters:  05/05/21 (!) 307 lb 0.2 oz (139.3 kg)  07/02/20 (!) 318 lb 5.5 oz (144.4 kg)  04/22/20 (!) 322 lb 6.4 oz (146.2 kg)    Immunization History  Administered Date(s) Administered   PFIZER(Purple Top)SARS-COV-2 Vaccination 09/22/2019, 10/16/2019   Tdap 03/21/2020    Diabetic Foot Exam - Simple   No data filed     Lab Results  Component Value Date   TSH 1.380 03/21/2020   Lab Results  Component Value Date   WBC 14.0 (H) 07/03/2020   HGB 12.8 07/03/2020   HCT 40.6 07/03/2020   MCV 94.2 07/03/2020   PLT 366 07/03/2020   Lab Results  Component Value Date   NA 136 07/03/2020   K 3.3 (L) 07/03/2020   CO2 23 07/03/2020   GLUCOSE 107 (H) 07/03/2020   BUN 9 07/03/2020   CREATININE 0.88 07/03/2020   BILITOT 0.7 07/03/2020   ALKPHOS 84 07/03/2020   AST 15 07/03/2020   ALT 16 07/03/2020   PROT 7.4 07/03/2020   ALBUMIN 3.4 (L) 07/03/2020   CALCIUM 8.8 (L) 07/03/2020   ANIONGAP 11 07/03/2020   Lab Results  Component Value Date   CHOL 201 (H) 03/21/2020   Lab Results  Component Value Date   HDL 46 03/21/2020   Lab Results  Component Value Date   LDLCALC 131 (H) 03/21/2020   Lab Results  Component Value Date   TRIG 134 03/21/2020   Lab Results  Component Value Date   CHOLHDL 4.4 03/21/2020   No results found for: HGBA1C     Assessment & Plan:   Problem List Items Addressed This Visit   None Visit Diagnoses     Hypertension, unspecified type    -  Primary   Relevant Medications   hydrochlorothiazide (HYDRODIURIL) 25 MG tablet   lisinopril (ZESTRIL) 20  MG tablet Encouraged to begin taking B/P at home Encouraged continued diet and exercise efforts  Encouraged continued compliance with medication     Other Relevant Orders   POCT URINALYSIS DIP (CLINITEK) (Completed)   Healthcare maintenance       Relevant Orders   MM Digital Screening   CBC with Differential/Platelet   Hemoglobin A1c   Comprehensive metabolic panel   Lipid panel   Allergic reaction, initial encounter       Relevant Orders   Ambulatory referral to Allergy   Gout, unspecified cause, unspecified chronicity, unspecified site       Relevant Medications   allopurinol (ZYLOPRIM) 100 MG tablet   Follow up in 1 mth for reevaluation of B/P and pap smear, sooner as needed    I have discontinued Crisol M. Bunt's Vitamin D (Ergocalciferol), ibuprofen, oxyCODONE, acetaminophen, and metFORMIN. I am also having her maintain her allopurinol, hydrochlorothiazide, and lisinopril.  Meds ordered this encounter  Medications   allopurinol (ZYLOPRIM) 100 MG tablet    Sig: Take 1 tablet (100 mg total) by mouth daily.    Dispense:  90 tablet    Refill:  3   hydrochlorothiazide (HYDRODIURIL) 25 MG tablet    Sig: Take 1 tablet (25 mg total) by mouth daily.    Dispense:  90 tablet    Refill:  3   lisinopril (ZESTRIL) 20 MG tablet    Sig: Take 1 tablet (20 mg total) by mouth daily.    Dispense:  90 tablet    Refill:  3     Kathrynn Speed, NP

## 2021-05-06 LAB — CBC WITH DIFFERENTIAL/PLATELET
Basophils Absolute: 0.1 10*3/uL (ref 0.0–0.2)
Basos: 1 %
EOS (ABSOLUTE): 0.3 10*3/uL (ref 0.0–0.4)
Eos: 2 %
Hematocrit: 44.6 % (ref 34.0–46.6)
Hemoglobin: 14.4 g/dL (ref 11.1–15.9)
Immature Grans (Abs): 0.1 10*3/uL (ref 0.0–0.1)
Immature Granulocytes: 1 %
Lymphocytes Absolute: 5.4 10*3/uL — ABNORMAL HIGH (ref 0.7–3.1)
Lymphs: 45 %
MCH: 28.3 pg (ref 26.6–33.0)
MCHC: 32.3 g/dL (ref 31.5–35.7)
MCV: 88 fL (ref 79–97)
Monocytes Absolute: 0.5 10*3/uL (ref 0.1–0.9)
Monocytes: 5 %
Neutrophils Absolute: 5.4 10*3/uL (ref 1.4–7.0)
Neutrophils: 46 %
Platelets: 395 10*3/uL (ref 150–450)
RBC: 5.08 x10E6/uL (ref 3.77–5.28)
RDW: 13 % (ref 11.7–15.4)
WBC: 11.7 10*3/uL — ABNORMAL HIGH (ref 3.4–10.8)

## 2021-05-06 LAB — LIPID PANEL
Chol/HDL Ratio: 3.8 ratio (ref 0.0–4.4)
Cholesterol, Total: 195 mg/dL (ref 100–199)
HDL: 51 mg/dL (ref >39)
LDL Chol Calc (NIH): 119 mg/dL — ABNORMAL HIGH (ref 0–99)
Triglycerides: 143 mg/dL (ref 0–149)
VLDL Cholesterol Cal: 25 mg/dL (ref 5–40)

## 2021-05-06 LAB — COMPREHENSIVE METABOLIC PANEL
ALT: 16 IU/L (ref 0–32)
AST: 16 IU/L (ref 0–40)
Albumin/Globulin Ratio: 1.2 (ref 1.2–2.2)
Albumin: 4.1 g/dL (ref 3.8–4.9)
Alkaline Phosphatase: 116 IU/L (ref 44–121)
BUN/Creatinine Ratio: 14 (ref 9–23)
BUN: 13 mg/dL (ref 6–24)
Bilirubin Total: 0.3 mg/dL (ref 0.0–1.2)
CO2: 22 mmol/L (ref 20–29)
Calcium: 9.7 mg/dL (ref 8.7–10.2)
Chloride: 104 mmol/L (ref 96–106)
Creatinine, Ser: 0.91 mg/dL (ref 0.57–1.00)
Globulin, Total: 3.4 g/dL (ref 1.5–4.5)
Glucose: 94 mg/dL (ref 70–99)
Potassium: 4.5 mmol/L (ref 3.5–5.2)
Sodium: 141 mmol/L (ref 134–144)
Total Protein: 7.5 g/dL (ref 6.0–8.5)
eGFR: 76 mL/min/{1.73_m2} (ref >59)

## 2021-05-06 LAB — HEMOGLOBIN A1C
Est. average glucose Bld gHb Est-mCnc: 137 mg/dL
Hgb A1c MFr Bld: 6.4 % — ABNORMAL HIGH (ref 4.8–5.6)

## 2021-05-18 ENCOUNTER — Ambulatory Visit (HOSPITAL_BASED_OUTPATIENT_CLINIC_OR_DEPARTMENT_OTHER): Payer: Managed Care, Other (non HMO) | Admitting: Radiology

## 2021-06-05 ENCOUNTER — Ambulatory Visit: Payer: Managed Care, Other (non HMO) | Admitting: Nurse Practitioner

## 2021-09-04 ENCOUNTER — Telehealth: Payer: Self-pay

## 2021-09-04 NOTE — Telephone Encounter (Signed)
Patient contacted to schedule mammogram.  ? ?Not able to lvm for patient.  ? ? ?RE: Mobile Mammo event located at: ? ?[email or web address]  ?      ?[address]    ?[place] [address]    ? ?Date: April 7th  ? ?

## 2022-10-27 ENCOUNTER — Ambulatory Visit (INDEPENDENT_AMBULATORY_CARE_PROVIDER_SITE_OTHER): Payer: Managed Care, Other (non HMO) | Admitting: Nurse Practitioner

## 2022-10-27 ENCOUNTER — Encounter: Payer: Self-pay | Admitting: Nurse Practitioner

## 2022-10-27 VITALS — BP 138/85 | HR 93 | Temp 97.5°F | Ht 63.0 in | Wt 330.4 lb

## 2022-10-27 DIAGNOSIS — Z1211 Encounter for screening for malignant neoplasm of colon: Secondary | ICD-10-CM | POA: Diagnosis not present

## 2022-10-27 DIAGNOSIS — E785 Hyperlipidemia, unspecified: Secondary | ICD-10-CM | POA: Insufficient documentation

## 2022-10-27 DIAGNOSIS — E559 Vitamin D deficiency, unspecified: Secondary | ICD-10-CM | POA: Diagnosis not present

## 2022-10-27 DIAGNOSIS — E119 Type 2 diabetes mellitus without complications: Secondary | ICD-10-CM | POA: Diagnosis not present

## 2022-10-27 DIAGNOSIS — Z1231 Encounter for screening mammogram for malignant neoplasm of breast: Secondary | ICD-10-CM | POA: Diagnosis not present

## 2022-10-27 DIAGNOSIS — E782 Mixed hyperlipidemia: Secondary | ICD-10-CM

## 2022-10-27 DIAGNOSIS — Z23 Encounter for immunization: Secondary | ICD-10-CM

## 2022-10-27 DIAGNOSIS — I10 Essential (primary) hypertension: Secondary | ICD-10-CM

## 2022-10-27 DIAGNOSIS — Z6841 Body Mass Index (BMI) 40.0 and over, adult: Secondary | ICD-10-CM | POA: Insufficient documentation

## 2022-10-27 DIAGNOSIS — Z124 Encounter for screening for malignant neoplasm of cervix: Secondary | ICD-10-CM

## 2022-10-27 DIAGNOSIS — M109 Gout, unspecified: Secondary | ICD-10-CM

## 2022-10-27 MED ORDER — SEMAGLUTIDE(0.25 OR 0.5MG/DOS) 2 MG/3ML ~~LOC~~ SOPN
0.2500 mg | PEN_INJECTOR | SUBCUTANEOUS | 0 refills | Status: DC
Start: 2022-10-27 — End: 2023-01-03

## 2022-10-27 MED ORDER — HYDROCHLOROTHIAZIDE 25 MG PO TABS
25.0000 mg | ORAL_TABLET | Freq: Every day | ORAL | 1 refills | Status: DC
Start: 2022-10-27 — End: 2023-01-24

## 2022-10-27 MED ORDER — LANCETS MISC. MISC: 1.0000 | Freq: Three times a day (TID) | 0 refills | Status: AC

## 2022-10-27 MED ORDER — LANCET DEVICE MISC: 1.0000 | Freq: Three times a day (TID) | 0 refills | Status: AC

## 2022-10-27 MED ORDER — LISINOPRIL 20 MG PO TABS: 20.0000 mg | ORAL_TABLET | Freq: Every day | ORAL | 1 refills | Status: DC

## 2022-10-27 MED ORDER — BLOOD GLUCOSE MONITORING SUPPL DEVI: 1.0000 | Freq: Three times a day (TID) | 0 refills | Status: AC

## 2022-10-27 MED ORDER — ALLOPURINOL 100 MG PO TABS: 100.0000 mg | ORAL_TABLET | Freq: Every day | ORAL | 3 refills | Status: DC

## 2022-10-27 MED ORDER — BLOOD GLUCOSE TEST VI STRP: ORAL_STRIP | 0 refills | Status: AC

## 2022-10-27 NOTE — Patient Instructions (Signed)
.   Screening for colon cancer  - Cologuard  . Screening mammogram for breast cancer  - MM Digital Screening; Future   Controlled type 2 diabetes mellitus without complication, without long-term current use of insulin (HCC)  - Microalbumin / creatinine urine ratio - Semaglutide,0.25 or 0.5MG /DOS, 2 MG/3ML SOPN; Inject 0.25 mg into the skin once a week.  Dispense: 3 mL; Refill: 0 Goal for fasting blood sugar ranges from 80 to 120 and 2 hours after any meal or at bedtime should be between 130 to 170.   Screening for cervical cancer  - Ambulatory referral to Gynecology   Hypertension, unspecified type  - hydrochlorothiazide (HYDRODIURIL) 25 MG tablet; Take 1 tablet (25 mg total) by mouth daily.  Dispense: 90 tablet; Refill: 1 - lisinopril (ZESTRIL) 20 MG tablet; Take 1 tablet (20 mg total) by mouth daily.  Dispense: 90 tablet; Refill: 1   Gout, unspecified cause, unspecified chronicity, unspecified site  - allopurinol (ZYLOPRIM) 100 MG tablet; Take 1 tablet (100 mg total) by mouth daily.  Dispense: 90 tablet; Refill: 3    It is important that you exercise regularly at least 30 minutes 5 times a week as tolerated  Think about what you will eat, plan ahead. Choose " clean, green, fresh or frozen" over canned, processed or packaged foods which are more sugary, salty and fatty. 70 to 75% of food eaten should be vegetables and fruit. Three meals at set times with snacks allowed between meals, but they must be fruit or vegetables. Aim to eat over a 12 hour period , example 7 am to 7 pm, and STOP after  your last meal of the day. Drink water,generally about 64 ounces per day, no other drink is as healthy. Fruit juice is best enjoyed in a healthy way, by EATING the fruit.  Thanks for choosing [facility] we consider it a privelige to serve you.

## 2022-10-27 NOTE — Assessment & Plan Note (Addendum)
BP Readings from Last 3 Encounters:  10/27/22 138/85  05/05/21 (!) 151/106  07/04/20 (!) 127/91   HTN Fairly controlled .  Currently on lisinopril 20 mg daily, hydrochlorothiazide 25 mg daily, medications refilled.  Consider increasing lisinopril at next visit if blood pressure is not less than 130/80 Continue current medications. No changes in management. Discussed DASH diet and dietary sodium restrictions CMP today Continue to increase dietary efforts and exercise.

## 2022-10-27 NOTE — Assessment & Plan Note (Signed)
Currently not on medication Checking fasting lipid panel LDL goal is less than 70 with known diagnosis of type 2 diabetes

## 2022-10-27 NOTE — Assessment & Plan Note (Signed)
Wt Readings from Last 3 Encounters:  10/27/22 (!) 330 lb 6.4 oz (149.9 kg)  05/05/21 (!) 307 lb 0.2 oz (139.3 kg)  07/02/20 (!) 318 lb 5.5 oz (144.4 kg)   Body mass index is 58.53 kg/m.  She does not exercise, avoids red meats Presentable for food consisting mainly vegetables and protein less carbohydrate drinking at least 64 ounces of water daily, portion control encouraged Patient encouraged to engage in regular moderate exercises at least 150 minutes weekly as tolerated Starting Ozempic for type 2 diabetes

## 2022-10-27 NOTE — Assessment & Plan Note (Signed)
No gout currently Takes allopurinol 100 mg daily medication refilled

## 2022-10-27 NOTE — Addendum Note (Signed)
Addended byRaj Janus on: 10/27/2022 02:06 PM   Modules accepted: Orders

## 2022-10-27 NOTE — Progress Notes (Addendum)
 New Patient Office Visit  Subjective:  Patient ID: Jessica Schaefer, female    DOB: [birth year]  Age: 52 y.o. MRN: [id number]  CC:  Chief Complaint  Patient presents with   Annual Exam    HPI Jessica Schaefer is a 52 y.o. female  has a past medical history of Anemia (1995), Gout, Hyperlipidemia, Hypertension, Prediabetes, and Vitamin D  deficiency (03/2020).  Patient presents to establish care for her chronic medical conditions.  Patient of [place] NP .  Hypertension.  Currently on hydrochlorothiazide  25 mg daily, lisinopril  20 mg daily.  Patient denies shortness of breath chest pain edema.  Due for shingles vaccine need for shingles vaccine discussed.  Screening mammogram ordered  Cologuard ordered to screen for colon cancer  Patient referred to OB/GYN for Pap smear  Past Medical History:  Diagnosis Date   Anemia 1995   Gout    Hyperlipidemia    Hypertension    Prediabetes    Vitamin D  deficiency 03/2020    Past Surgical History:  Procedure Laterality Date   BREAST SURGERY     REDUCTION; 1997-1998   CHOLECYSTECTOMY N/A 07/03/2020   Procedure: LAPAROSCOPIC CHOLECYSTECTOMY;  Surgeon: Oza Blumenthal, MD;  Location: [facility];  Service: General;  Laterality: N/A;    Family History  Problem Relation Age of Onset   Early death Mother    Hypertension Mother    Obesity Mother     Social History   Socioeconomic History   Marital status: Single    Spouse name: Not on file   Number of children: 2   Years of education: Not on file   Highest education level: 12th grade  Occupational History   Not on file  Tobacco Use   Smoking status: Never   Smokeless tobacco: Never  Vaping Use   Vaping status: Not on file  Substance and Sexual Activity   Alcohol use: No   Drug use: No   Sexual activity: Not Currently  Other Topics Concern   Not on file  Social History Narrative   Lives with her daughter    Social Drivers of Health   Financial Resource Strain: Not  on file  Food Insecurity: No Food Insecurity (01/07/2023)   Hunger Vital Sign    Worried About Running Out of Food in the Last Year: Never true    Ran Out of Food in the Last Year: Never true  Transportation Needs: No Transportation Needs (01/07/2023)   PRAPARE - Administrator, Civil Service (Medical): No    Lack of Transportation (Non-Medical): No  Physical Activity: Insufficiently Active (01/07/2023)   Exercise Vital Sign    Days of Exercise per Week: 1 day    Minutes of Exercise per Session: 10 min  Stress: Not on file  Social Connections: Moderately Isolated (01/07/2023)   Social Connection and Isolation Panel [NHANES]    Frequency of Communication with Friends and Family: Twice a week    Frequency of Social Gatherings with Friends and Family: Once a week    Attends Religious Services: 1 to 4 times per year    Active Member of [employer] or Organizations: No    Attends Engineer, structural: Not on file    Marital Status: Never married  Intimate Partner Violence: Not on file    ROS Review of Systems  Constitutional:  Negative for activity change, appetite change, chills, fatigue and fever.  HENT:  Negative for congestion, dental problem, ear discharge, ear pain, hearing loss, rhinorrhea, sinus  pressure, sinus pain, sneezing and sore throat.   Eyes:  Negative for pain, discharge, redness and itching.  Respiratory:  Negative for cough, chest tightness, shortness of breath and wheezing.   Cardiovascular:  Negative for chest pain, palpitations and leg swelling.  Gastrointestinal:  Negative for abdominal distention, abdominal pain, anal bleeding, blood in stool, constipation, diarrhea, nausea, rectal pain and vomiting.  Endocrine: Negative for cold intolerance, heat intolerance, polydipsia, polyphagia and polyuria.  Genitourinary:  Negative for difficulty urinating, dysuria, flank pain, frequency, hematuria, menstrual problem, pelvic pain and vaginal bleeding.   Musculoskeletal:  Negative for arthralgias, back pain, gait problem, joint swelling and myalgias.  Skin:  Negative for color change, pallor, rash and wound.  Allergic/Immunologic: Negative for environmental allergies, food allergies and immunocompromised state.  Neurological:  Negative for dizziness, tremors, facial asymmetry, weakness and headaches.  Hematological:  Negative for adenopathy. Does not bruise/bleed easily.  Psychiatric/Behavioral:  Negative for agitation, behavioral problems, confusion, decreased concentration, hallucinations, self-injury and suicidal ideas.     Objective:   Today's Vitals: BP 138/85   Pulse 93   Temp (!) 97.5 F (36.4 C)   Ht 5\' 3"  (1.6 m)   Wt (!) 330 lb 6.4 oz (149.9 kg)   SpO2 99%   BMI 58.53 kg/m   Physical Exam Vitals and nursing note reviewed. Exam conducted with a chaperone present.  Constitutional:      General: She is not in acute distress.    Appearance: Normal appearance. She is obese. She is not ill-appearing, toxic-appearing or diaphoretic.  HENT:     Right Ear: Tympanic membrane, ear canal and external ear normal. There is no impacted cerumen.     Left Ear: Tympanic membrane, ear canal and external ear normal. There is no impacted cerumen.     Nose: Nose normal. No congestion or rhinorrhea.     Mouth/Throat:     Mouth: Mucous membranes are moist.     Pharynx: Oropharynx is clear. No oropharyngeal exudate or posterior oropharyngeal erythema.  Eyes:     General: No scleral icterus.       Right eye: No discharge.        Left eye: No discharge.     Extraocular Movements: Extraocular movements intact.     Conjunctiva/sclera: Conjunctivae normal.  Neck:     Vascular: No carotid bruit.  Cardiovascular:     Rate and Rhythm: Normal rate and regular rhythm.     Pulses: Normal pulses.     Heart sounds: Normal heart sounds. No murmur heard.    No friction rub. No gallop.  Pulmonary:     Effort: Pulmonary effort is normal. No  respiratory distress.     Breath sounds: Normal breath sounds. No stridor. No wheezing, rhonchi or rales.  Chest:     Chest wall: No tenderness.  Abdominal:     General: Bowel sounds are normal. There is no distension.     Palpations: Abdomen is soft. There is no mass.     Tenderness: There is no abdominal tenderness. There is no right CVA tenderness, left CVA tenderness, guarding or rebound.     Hernia: No hernia is present.  Musculoskeletal:        General: No swelling, tenderness, deformity or signs of injury.     Cervical back: Normal range of motion and neck supple. No rigidity or tenderness.     Right lower leg: No edema.     Left lower leg: No edema.  Lymphadenopathy:  Cervical: No cervical adenopathy.  Skin:    General: Skin is warm and dry.     Capillary Refill: Capillary refill takes 2 to 3 seconds.     Coloration: Skin is not jaundiced or pale.     Findings: No bruising, erythema, lesion or rash.  Neurological:     Mental Status: She is alert and oriented to person, place, and time.     Cranial Nerves: No cranial nerve deficit.     Sensory: No sensory deficit.     Motor: No weakness.     Coordination: Coordination normal.     Gait: Gait normal.     Deep Tendon Reflexes: Reflexes normal.  Psychiatric:        Mood and Affect: Mood normal.        Behavior: Behavior normal.        Thought Content: Thought content normal.        Judgment: Judgment normal.     Assessment & Plan:   Problem List Items Addressed This Visit       Cardiovascular and Mediastinum   High blood pressure   BP Readings from Last 3 Encounters:  10/27/22 138/85  05/05/21 (!) 151/106  07/04/20 (!) 127/91   HTN Fairly controlled .  Currently on lisinopril  20 mg daily, hydrochlorothiazide  25 mg daily, medications refilled.  Consider increasing lisinopril  at next visit if blood pressure is not less than 130/80 Continue current medications. No changes in management. Discussed DASH diet and  dietary sodium restrictions CMP today Continue to increase dietary efforts and exercise.         Relevant Orders   CMP14+EGFR (Completed)   CBC (Completed)     Endocrine   Controlled type 2 diabetes mellitus (HCC)   A1c 6.6 Start Ozempic  0.25 mg once weekly injection.  This will also assist with weight loss she denies personal history of pancreatitis no history of medullary thyroid cancer.  Patient encouraged to avoid fatty fried foods and eat smaller portion of meals while on medication CBG meter ordered Patient counseled on low-carb modified diet Importance of exercise discussed CBG goals were discussed Checking urine microalbumin labs, lipid panel Will discuss referral for diabetic eye exam and do diabetic foot exam at next visit Follow-up in 4 weeks      Relevant Orders   Microalbumin / creatinine urine ratio   POCT glycosylated hemoglobin (Hb A1C) (Completed)     Other   Vitamin D  deficiency   Currently not on medication Check vitamin D  levels Last vitamin D  Lab Results  Component Value Date   VD25OH 8.3 (L) 03/21/2020         Relevant Orders   VITAMIN D  25 Hydroxy (Vit-D Deficiency, Fractures) (Completed)   Hyperlipidemia   Currently not on medication Checking fasting lipid panel LDL goal is less than 70 with known diagnosis of type 2 diabetes      Relevant Orders   Lipid panel (Completed)   Class 3 severe obesity with serious comorbidity and body mass index (BMI) of 50.0 to 59.9 in adult   Wt Readings from Last 3 Encounters:  10/27/22 (!) 330 lb 6.4 oz (149.9 kg)  05/05/21 (!) 307 lb 0.2 oz (139.3 kg)  07/02/20 (!) 318 lb 5.5 oz (144.4 kg)   Body mass index is 58.53 kg/m.  She does not exercise, avoids red meats Presentable for food consisting mainly vegetables and protein less carbohydrate drinking at least 64 ounces of water daily, portion control encouraged Patient encouraged to engage  in regular moderate exercises at least 150 minutes weekly as  tolerated Starting Ozempic  for type 2 diabetes      Gout   No gout currently Takes allopurinol  100 mg daily medication refilled      Other Visit Diagnoses       Screening for colon cancer    -  Primary   Relevant Orders   Cologuard (Completed)     Screening mammogram for breast cancer       Relevant Orders   MM Digital Screening     Screening for cervical cancer       Relevant Orders   Ambulatory referral to Gynecology       Outpatient Encounter Medications as of 10/27/2022  Medication Sig   Blood Glucose Monitoring Suppl DEVI 1 each by Does not apply route in the morning, at noon, and at bedtime. May substitute to any manufacturer covered by patient's insurance. (Patient not taking: Reported on 10/25/2023)   Glucose Blood (BLOOD GLUCOSE TEST STRIPS) STRP May substitute to any manufacturer covered by patient's insurance. (Patient not taking: Reported on 10/25/2023)   [EXPIRED] Lancet Device MISC 1 each by Does not apply route in the morning, at noon, and at bedtime. May substitute to any manufacturer covered by patient's insurance.   [EXPIRED] Lancets Misc. MISC 1 each by Does not apply route in the morning, at noon, and at bedtime. May substitute to any manufacturer covered by patient's insurance.   [DISCONTINUED] allopurinol  (ZYLOPRIM ) 100 MG tablet Take 1 tablet (100 mg total) by mouth daily.   [DISCONTINUED] hydrochlorothiazide  (HYDRODIURIL ) 25 MG tablet Take 1 tablet (25 mg total) by mouth daily.   [DISCONTINUED] lisinopril  (ZESTRIL ) 20 MG tablet Take 1 tablet (20 mg total) by mouth daily.   [DISCONTINUED] Semaglutide ,0.25 or 0.5MG /DOS, 2 MG/3ML SOPN Inject 0.25 mg into the skin once a week.   [DISCONTINUED] allopurinol  (ZYLOPRIM ) 100 MG tablet Take 1 tablet (100 mg total) by mouth daily.   [DISCONTINUED] hydrochlorothiazide  (HYDRODIURIL ) 25 MG tablet Take 1 tablet (25 mg total) by mouth daily.   [DISCONTINUED] lisinopril  (ZESTRIL ) 20 MG tablet Take 1 tablet (20 mg total) by mouth  daily.   No facility-administered encounter medications on file as of 10/27/2022.    Follow-up: Return in about 4 weeks (around 11/24/2022) for DM.   Armella Stogner R Brennah Quraishi, FNP

## 2022-10-27 NOTE — Assessment & Plan Note (Signed)
Currently not on medication Check vitamin D levels Last vitamin D Lab Results  Component Value Date   VD25OH 8.3 (L) 03/21/2020

## 2022-10-27 NOTE — Assessment & Plan Note (Addendum)
A1c 6.6 Start Ozempic 0.25 mg once weekly injection.  This will also assist with weight loss she denies personal history of pancreatitis no history of medullary thyroid cancer.  Patient encouraged to avoid fatty fried foods and eat smaller portion of meals while on medication CBG meter ordered Patient counseled on low-carb modified diet Importance of exercise discussed CBG goals were discussed Checking urine microalbumin labs, lipid panel Will discuss referral for diabetic eye exam and do diabetic foot exam at next visit Follow-up in 4 weeks

## 2022-10-27 NOTE — Assessment & Plan Note (Deleted)
Patient educated on CDC recommendation for the shingles vaccine. Verbal consent was obtained from the patient, vaccine administered by nurse, no sign of adverse reactions noted at this time. Patient education on arm soreness and use of tylenol or ibuprofen  for this patient  was discussed. Patient educated on the signs and symptoms of adverse effect and advise to contact the office if they occur.  

## 2022-10-28 ENCOUNTER — Other Ambulatory Visit: Payer: Self-pay

## 2022-10-28 LAB — LIPID PANEL
Chol/HDL Ratio: 3.7 ratio (ref 0.0–4.4)
Cholesterol, Total: 186 mg/dL (ref 100–199)
HDL: 50 mg/dL (ref >39)
LDL Chol Calc (NIH): 114 mg/dL — ABNORMAL HIGH (ref 0–99)
Triglycerides: 121 mg/dL (ref 0–149)
VLDL Cholesterol Cal: 22 mg/dL (ref 5–40)

## 2022-10-28 LAB — CBC
Hematocrit: 44 % (ref 34.0–46.6)
Hemoglobin: 14.3 g/dL (ref 11.1–15.9)
MCH: 28.9 pg (ref 26.6–33.0)
MCHC: 32.5 g/dL (ref 31.5–35.7)
MCV: 89 fL (ref 79–97)
Platelets: 335 10*3/uL (ref 150–450)
RBC: 4.94 x10E6/uL (ref 3.77–5.28)
RDW: 13.6 % (ref 11.7–15.4)
WBC: 13.2 10*3/uL — ABNORMAL HIGH (ref 3.4–10.8)

## 2022-10-28 LAB — CMP14+EGFR
ALT: 20 IU/L (ref 0–32)
AST: 18 IU/L (ref 0–40)
Albumin/Globulin Ratio: 1.1 — ABNORMAL LOW (ref 1.2–2.2)
Albumin: 4 g/dL (ref 3.8–4.9)
Alkaline Phosphatase: 139 IU/L — ABNORMAL HIGH (ref 44–121)
BUN/Creatinine Ratio: 14 (ref 9–23)
BUN: 13 mg/dL (ref 6–24)
Bilirubin Total: 0.3 mg/dL (ref 0.0–1.2)
CO2: 22 mmol/L (ref 20–29)
Calcium: 9.4 mg/dL (ref 8.7–10.2)
Chloride: 102 mmol/L (ref 96–106)
Creatinine, Ser: 0.96 mg/dL (ref 0.57–1.00)
Globulin, Total: 3.5 g/dL (ref 1.5–4.5)
Glucose: 107 mg/dL — ABNORMAL HIGH (ref 70–99)
Potassium: 3.7 mmol/L (ref 3.5–5.2)
Sodium: 140 mmol/L (ref 134–144)
Total Protein: 7.5 g/dL (ref 6.0–8.5)
eGFR: 71 mL/min/{1.73_m2} (ref >59)

## 2022-10-28 LAB — TSH: TSH: 1.03 u[IU]/mL (ref 0.450–4.500)

## 2022-10-28 LAB — VITAMIN D 25 HYDROXY (VIT D DEFICIENCY, FRACTURES): Vit D, 25-Hydroxy: 16.2 ng/mL — ABNORMAL LOW (ref 30.0–100.0)

## 2022-10-29 ENCOUNTER — Other Ambulatory Visit: Payer: Self-pay | Admitting: Nurse Practitioner

## 2022-10-29 DIAGNOSIS — E785 Hyperlipidemia, unspecified: Secondary | ICD-10-CM

## 2022-10-29 MED ORDER — ATORVASTATIN CALCIUM 10 MG PO TABS
10.0000 mg | ORAL_TABLET | Freq: Every day | ORAL | 1 refills | Status: DC
Start: 2022-10-29 — End: 2023-01-24

## 2022-11-02 ENCOUNTER — Other Ambulatory Visit: Payer: Self-pay

## 2022-11-02 LAB — POCT GLYCOSYLATED HEMOGLOBIN (HGB A1C): Hemoglobin A1C: 6.6 % — AB (ref 4.0–5.6)

## 2022-11-02 NOTE — Addendum Note (Signed)
Addended byRaj Janus on: 11/02/2022 03:47 PM   Modules accepted: Orders

## 2022-11-10 LAB — COLOGUARD: COLOGUARD: NEGATIVE

## 2022-11-16 ENCOUNTER — Other Ambulatory Visit: Payer: Self-pay

## 2022-11-23 ENCOUNTER — Other Ambulatory Visit: Payer: Self-pay

## 2022-11-24 ENCOUNTER — Ambulatory Visit: Payer: Self-pay | Admitting: Nurse Practitioner

## 2022-11-30 ENCOUNTER — Other Ambulatory Visit: Payer: Self-pay

## 2022-12-02 ENCOUNTER — Other Ambulatory Visit: Payer: Self-pay

## 2022-12-03 ENCOUNTER — Telehealth: Payer: Self-pay

## 2022-12-03 ENCOUNTER — Other Ambulatory Visit: Payer: Self-pay

## 2022-12-03 NOTE — Telephone Encounter (Signed)
A prior authorization request for Ozempic has been approved until 12/02/2023. [facility] was contacted an able to successfully process the prescription through insurance. MyChart message sent to patient to notify.

## 2022-12-09 ENCOUNTER — Other Ambulatory Visit: Payer: Self-pay

## 2022-12-14 ENCOUNTER — Encounter: Payer: Managed Care, Other (non HMO) | Admitting: Obstetrics and Gynecology

## 2022-12-30 IMAGING — US US ABDOMEN LIMITED
1 series · 14 of 25 positions shown · non-contrast
Comparison: Same-day CT

CLINICAL DATA: Right-sided abdominal pain

EXAM:
ULTRASOUND ABDOMEN LIMITED RIGHT UPPER QUADRANT

[Series 1: us abdomen limited · 14 of 38 slices shown]
[im 1/38]
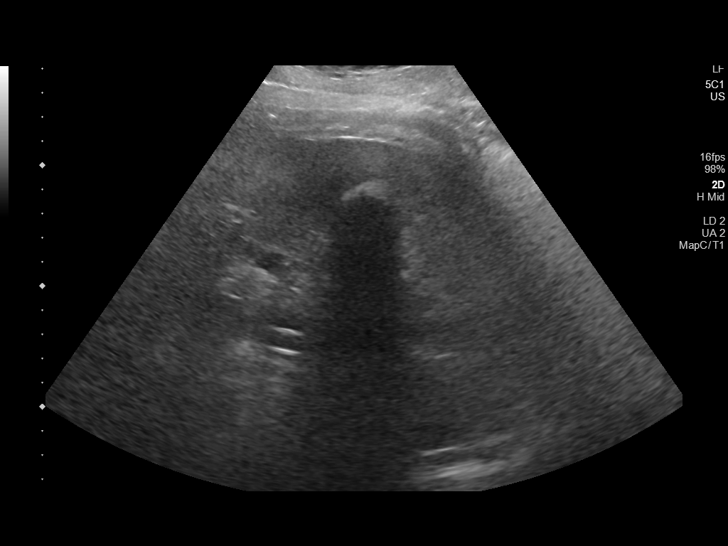
[im 4/38]
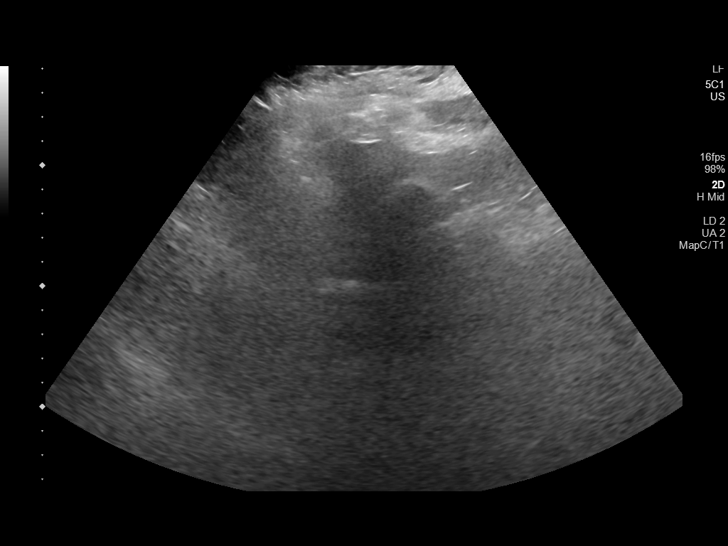
[im 7/38]
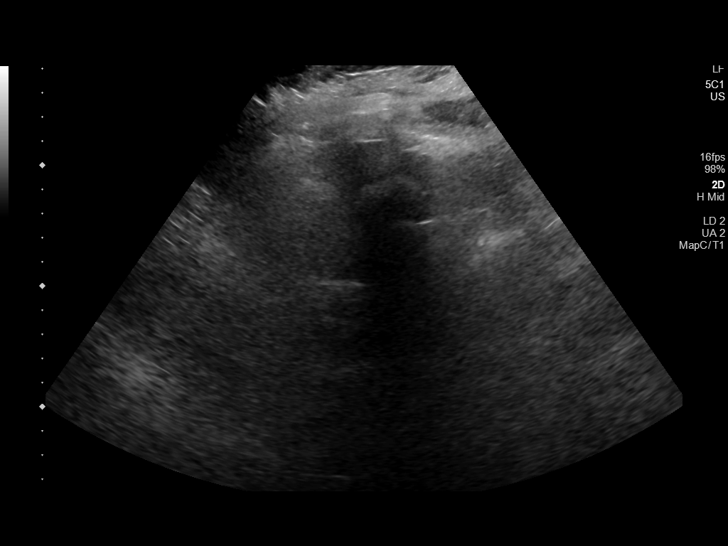
[im 10/38]
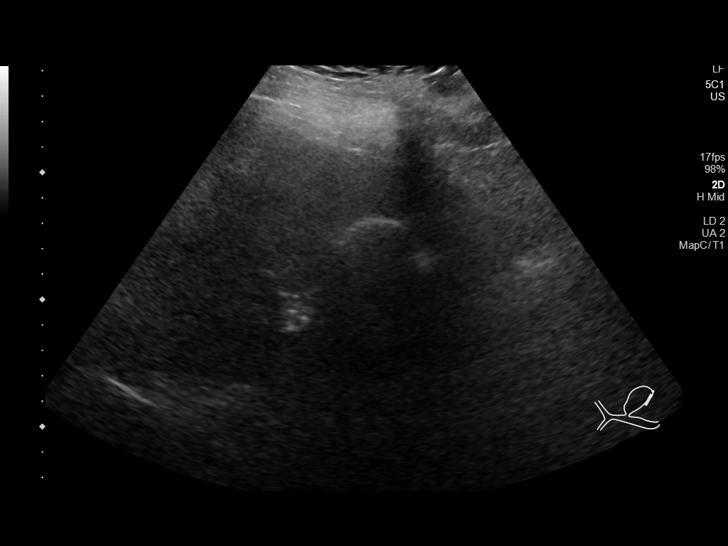
[im 13/38]
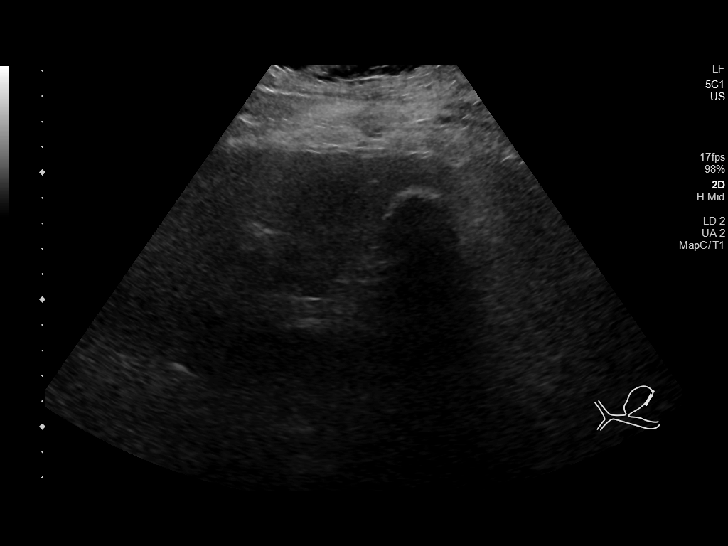
[im 14/38]
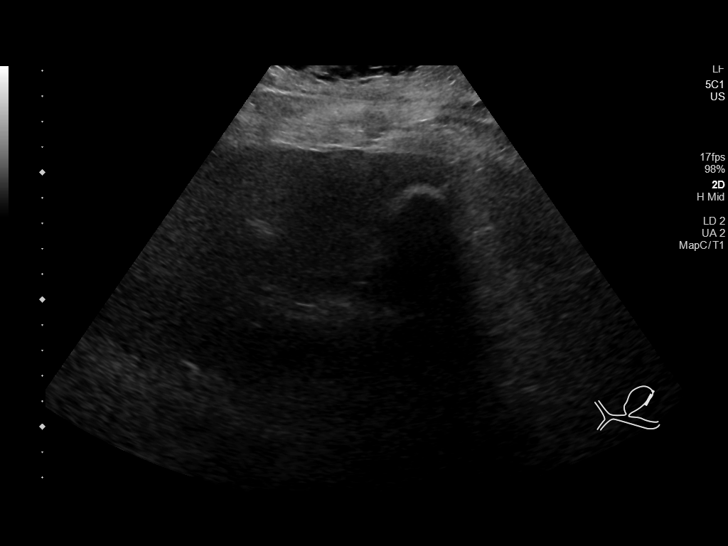
[im 17/38]
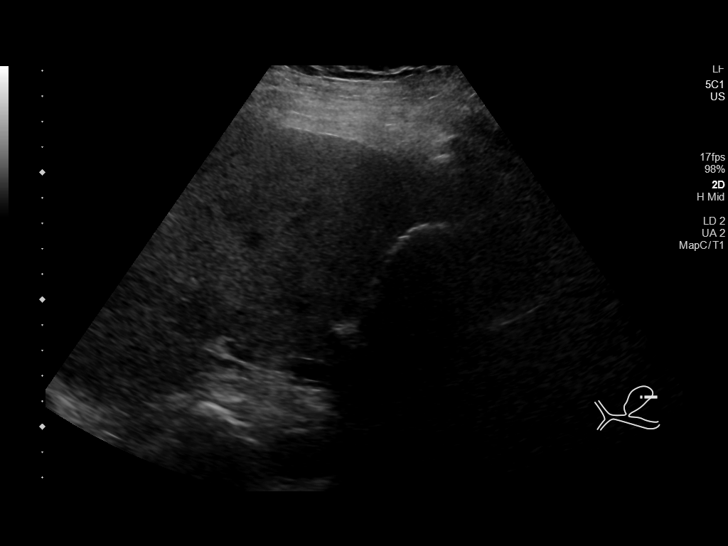
[im 21/38]
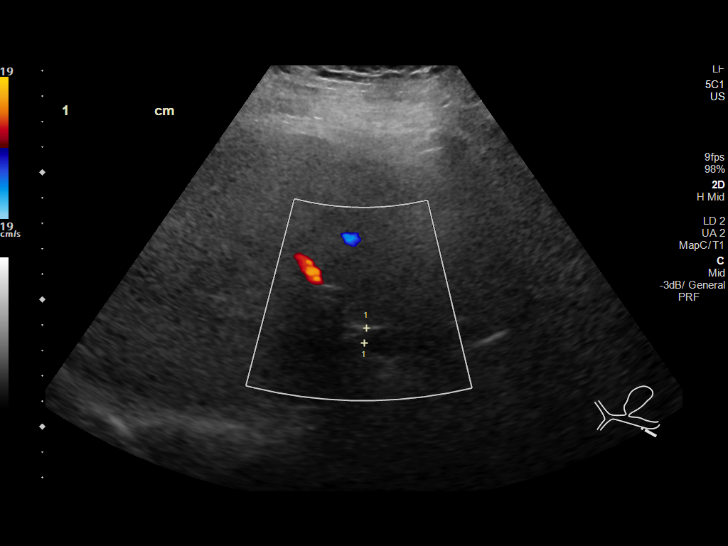
[im 24/38]
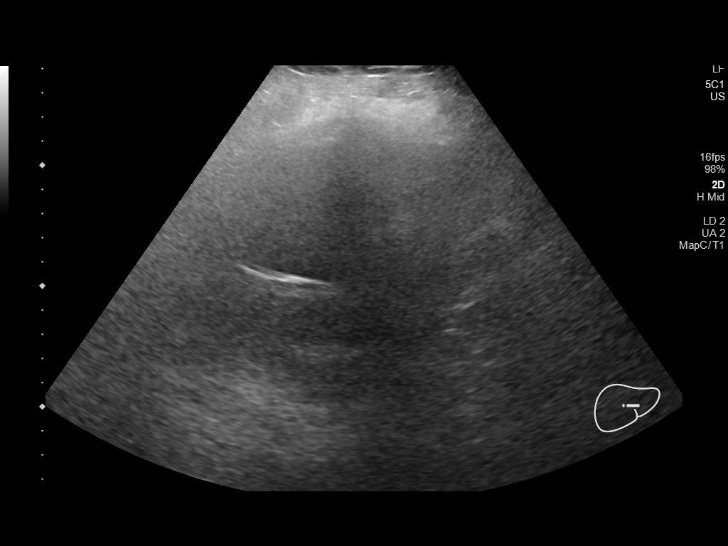
[im 25/38]
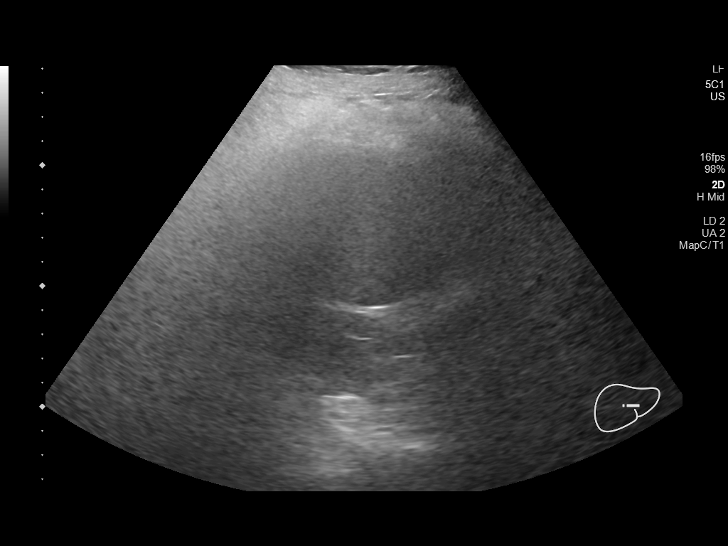
[im 28/38]
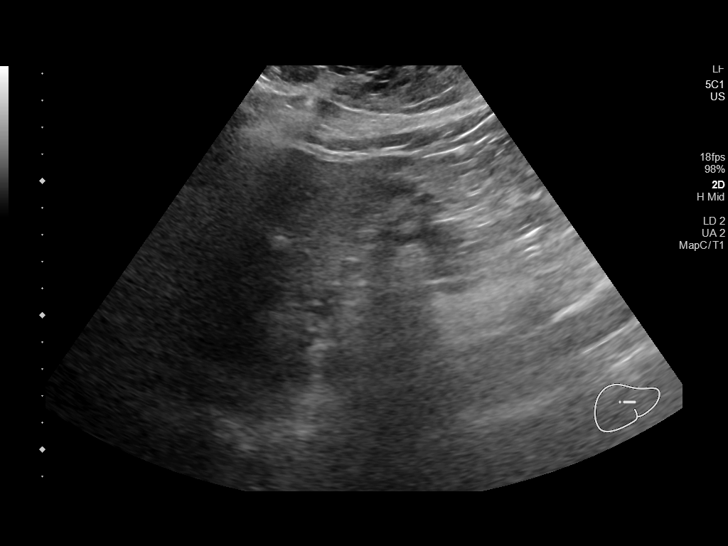
[im 31/38]
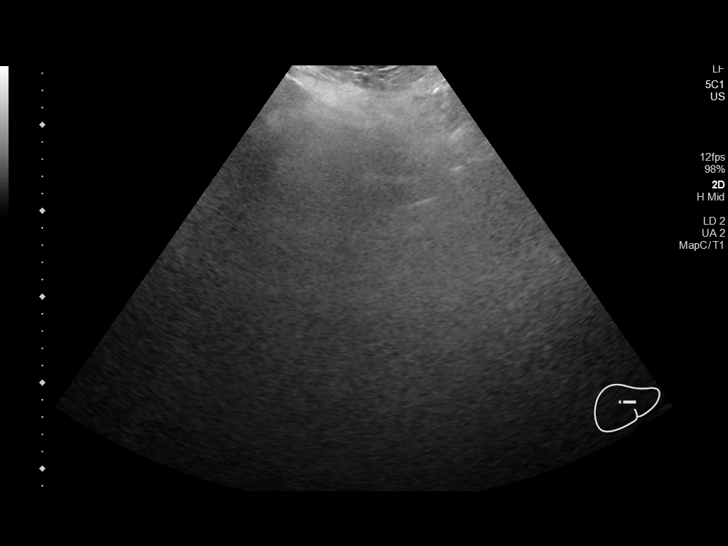
[im 34/38]
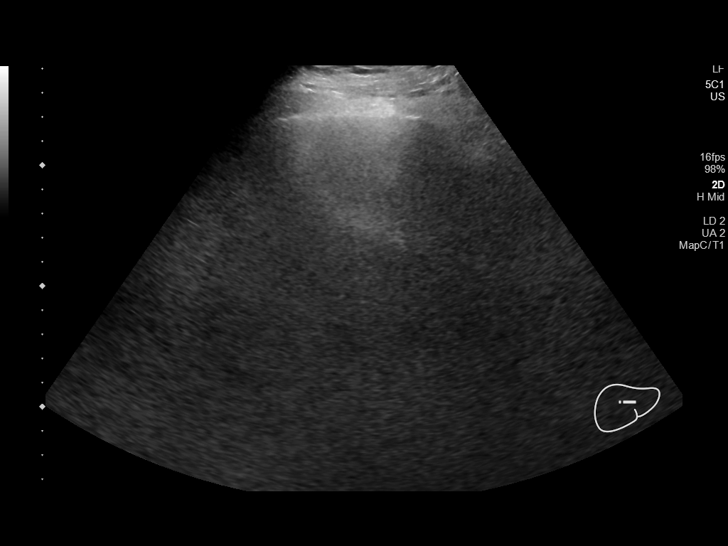
[im 38/38]
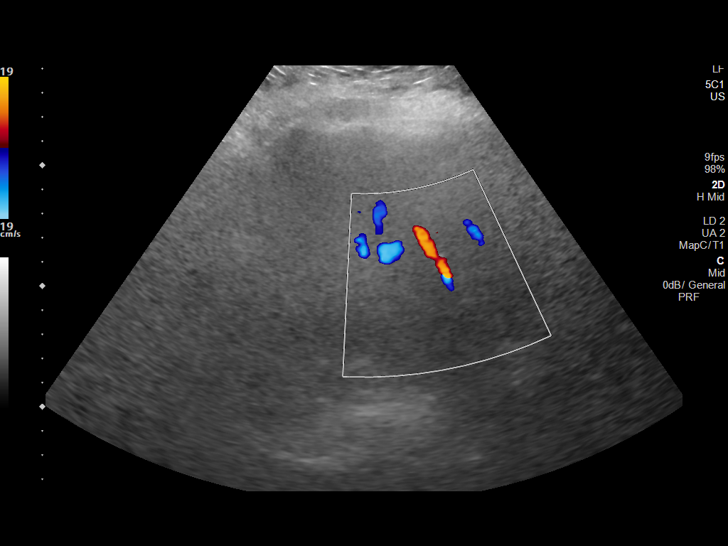

[14 of 25 positions shown; findings below may reference images not displayed]

FINDINGS: Technical note: Examination is limited secondary to poor penetration
from patient body habitus.

Gallbladder:

Gallbladder lumen completely filled with stones with a
wall-echo-shadow appearance. No pericholecystic fluid is evident.
Sonographer did not report the presence of a Murphy sign.

Common bile duct:

Diameter: 6 mm.

Liver:

Limited. No focal lesion identified. Within normal limits in
parenchymal echogenicity. Portal vein is patent on color Doppler
imaging with normal direction of blood flow towards the liver.

Other: None.
IMPRESSION: 1. Limited exam.  Liver poorly visualized.
2. Cholelithiasis without sonographic evidence of cholecystitis.

## 2023-01-03 ENCOUNTER — Other Ambulatory Visit: Payer: Self-pay

## 2023-01-03 DIAGNOSIS — E119 Type 2 diabetes mellitus without complications: Secondary | ICD-10-CM

## 2023-01-03 MED ORDER — SEMAGLUTIDE(0.25 OR 0.5MG/DOS) 2 MG/3ML ~~LOC~~ SOPN
0.2500 mg | PEN_INJECTOR | SUBCUTANEOUS | 0 refills | Status: DC
Start: 2023-01-03 — End: 2023-02-23

## 2023-01-07 ENCOUNTER — Ambulatory Visit: Payer: Self-pay | Admitting: Nurse Practitioner

## 2023-01-11 ENCOUNTER — Ambulatory Visit: Payer: Managed Care, Other (non HMO) | Admitting: Nurse Practitioner

## 2023-01-23 ENCOUNTER — Other Ambulatory Visit: Payer: Self-pay | Admitting: Nurse Practitioner

## 2023-01-23 DIAGNOSIS — I10 Essential (primary) hypertension: Secondary | ICD-10-CM

## 2023-01-23 DIAGNOSIS — E785 Hyperlipidemia, unspecified: Secondary | ICD-10-CM

## 2023-02-23 ENCOUNTER — Other Ambulatory Visit: Payer: Self-pay | Admitting: Nurse Practitioner

## 2023-02-23 DIAGNOSIS — E119 Type 2 diabetes mellitus without complications: Secondary | ICD-10-CM

## 2023-02-23 MED ORDER — SEMAGLUTIDE(0.25 OR 0.5MG/DOS) 2 MG/3ML ~~LOC~~ SOPN
0.2500 mg | PEN_INJECTOR | SUBCUTANEOUS | 1 refills | Status: DC
Start: 2023-02-23 — End: 2023-05-27

## 2023-02-28 ENCOUNTER — Ambulatory Visit: Payer: Self-pay | Admitting: Nurse Practitioner

## 2023-05-27 ENCOUNTER — Ambulatory Visit: Payer: Self-pay | Admitting: Nurse Practitioner

## 2023-05-27 ENCOUNTER — Telehealth: Payer: Self-pay

## 2023-05-27 ENCOUNTER — Other Ambulatory Visit: Payer: Self-pay | Admitting: Nurse Practitioner

## 2023-05-27 DIAGNOSIS — E119 Type 2 diabetes mellitus without complications: Secondary | ICD-10-CM

## 2023-05-27 NOTE — Telephone Encounter (Signed)
Unable to reach patient x 3. Left message on voicemail. Routed to  office.

## 2023-05-27 NOTE — Telephone Encounter (Addendum)
Copied from CRM [id number]. Topic: Clinical - Medication Question >> May 27, 2023  8:53 AM Louie Casa B wrote: Reason for CRM: pt missed a call and think that is about her medication she not sure but she would like a call back pt said leave  VOICEMAIL IF SHE IS NOT ABLE TO ANSWER  Chief Complaint:medication she not sure but she would like a call back pt said leave  VOICEMAIL IF SHE IS NOT ABLE TO ANSWER  705am  No  Answer .0700am 736am  No answer 736am

## 2023-05-27 NOTE — Telephone Encounter (Signed)
Copied from CRM [id number]. Topic: Clinical - Medication Question >> May 27, 2023  8:53 AM Louie Casa B wrote: Reason for CRM: pt missed a call and think that is about her medication she not sure but she would like a call back pt said leave  VOICEMAIL IF SHE IS NOT ABLE TO ANSWER

## 2023-05-27 NOTE — Telephone Encounter (Signed)
  Chief Complaint: Patient was returning call back to office regarding medication.  Additional Notes: Provider not indicated patient need to make an appointment to discuss the medication.   RN Natasha Bence made 3 call back attempts with out any success at reaching patient. Left message to return call to the office to schedule.

## 2023-05-27 NOTE — Telephone Encounter (Signed)
Patient returned a call from the office she would like a call a back.

## 2023-05-27 NOTE — Telephone Encounter (Signed)
Pt was called to scheduled an appt. No answer and lvm for call back. KH

## 2023-07-24 ENCOUNTER — Other Ambulatory Visit: Payer: Self-pay | Admitting: Nurse Practitioner

## 2023-07-24 DIAGNOSIS — M109 Gout, unspecified: Secondary | ICD-10-CM

## 2023-07-25 NOTE — Telephone Encounter (Signed)
Please advise kH

## 2023-09-04 ENCOUNTER — Other Ambulatory Visit: Payer: Self-pay | Admitting: Nurse Practitioner

## 2023-09-04 DIAGNOSIS — E119 Type 2 diabetes mellitus without complications: Secondary | ICD-10-CM

## 2023-09-05 ENCOUNTER — Other Ambulatory Visit: Payer: Self-pay | Admitting: Nurse Practitioner

## 2023-09-05 DIAGNOSIS — E119 Type 2 diabetes mellitus without complications: Secondary | ICD-10-CM

## 2023-09-05 MED ORDER — OZEMPIC (0.25 OR 0.5 MG/DOSE) 2 MG/3ML ~~LOC~~ SOPN
0.2500 mg | PEN_INJECTOR | SUBCUTANEOUS | 0 refills | Status: DC
Start: 2023-09-05 — End: 2023-10-20

## 2023-09-05 NOTE — Telephone Encounter (Signed)
 Please advise [facility]

## 2023-10-20 ENCOUNTER — Other Ambulatory Visit: Payer: Self-pay | Admitting: Nurse Practitioner

## 2023-10-20 ENCOUNTER — Other Ambulatory Visit: Payer: Self-pay

## 2023-10-20 DIAGNOSIS — E119 Type 2 diabetes mellitus without complications: Secondary | ICD-10-CM

## 2023-10-20 NOTE — Telephone Encounter (Signed)
 Please advise [facility]

## 2023-10-21 NOTE — Telephone Encounter (Signed)
 Front office was ask to call and make appt. KH

## 2023-10-21 NOTE — Telephone Encounter (Signed)
 Done [facility]

## 2023-10-21 NOTE — Telephone Encounter (Signed)
 Sent pt [employer]. KH

## 2023-10-25 ENCOUNTER — Ambulatory Visit (INDEPENDENT_AMBULATORY_CARE_PROVIDER_SITE_OTHER): Payer: Self-pay | Admitting: Nurse Practitioner

## 2023-10-25 ENCOUNTER — Encounter: Payer: Self-pay | Admitting: Nurse Practitioner

## 2023-10-25 VITALS — BP 103/75 | HR 104 | Temp 97.0°F | Wt 302.4 lb

## 2023-10-25 DIAGNOSIS — E119 Type 2 diabetes mellitus without complications: Secondary | ICD-10-CM | POA: Diagnosis not present

## 2023-10-25 DIAGNOSIS — I10 Essential (primary) hypertension: Secondary | ICD-10-CM

## 2023-10-25 DIAGNOSIS — Z6841 Body Mass Index (BMI) 40.0 and over, adult: Secondary | ICD-10-CM

## 2023-10-25 DIAGNOSIS — E66813 Obesity, class 3: Secondary | ICD-10-CM

## 2023-10-25 DIAGNOSIS — Z23 Encounter for immunization: Secondary | ICD-10-CM

## 2023-10-25 DIAGNOSIS — Z124 Encounter for screening for malignant neoplasm of cervix: Secondary | ICD-10-CM

## 2023-10-25 DIAGNOSIS — E785 Hyperlipidemia, unspecified: Secondary | ICD-10-CM | POA: Diagnosis not present

## 2023-10-25 LAB — POCT GLYCOSYLATED HEMOGLOBIN (HGB A1C): Hemoglobin A1C: 6.1 % — AB (ref 4.0–5.6)

## 2023-10-25 MED ORDER — SEMAGLUTIDE(0.25 OR 0.5MG/DOS) 2 MG/3ML ~~LOC~~ SOPN
0.5000 mg | PEN_INJECTOR | SUBCUTANEOUS | 3 refills | Status: DC
Start: 2023-10-25 — End: 2023-11-25

## 2023-10-25 MED ORDER — HYDROCHLOROTHIAZIDE 25 MG PO TABS: 25.0000 mg | ORAL_TABLET | Freq: Every day | ORAL | 1 refills | Status: AC

## 2023-10-25 MED ORDER — LISINOPRIL 20 MG PO TABS: 20.0000 mg | ORAL_TABLET | Freq: Every day | ORAL | 1 refills | Status: DC

## 2023-10-25 NOTE — Assessment & Plan Note (Signed)
 Well-controlled on hydrochlorothiazide  25 mg daily, lisinopril  20 mg daily Continue current medications DASH diet and commitment to daily physical activity for a minimum of 30 minutes discussed and encouraged, as a part of hypertension management. The importance of attaining a healthy weight is also discussed.     10/25/2023    2:34 PM 10/27/2022   11:26 AM 05/05/2021   10:30 AM 07/04/2020    6:28 AM 07/04/2020    2:01 AM 07/03/2020    9:38 PM 07/03/2020    7:53 PM  BP/Weight  Systolic BP 103 138 151 127 128 146 153  Diastolic BP 75 85 106 91 84 90 103  Wt. (Lbs) 302.4 330.4 307.01      BMI 53.57 kg/m2 58.53 kg/m2 54.38 kg/m2

## 2023-10-25 NOTE — Assessment & Plan Note (Signed)
 Lab Results  Component Value Date   CHOL 186 10/27/2022   HDL 50 10/27/2022   LDLCALC 114 (H) 10/27/2022   TRIG 121 10/27/2022   CHOLHDL 3.7 10/27/2022  Currently on atorvastatin  10 mg daily Checking direct LDL LDL goal is less than 70

## 2023-10-25 NOTE — Patient Instructions (Addendum)
 Please call [phone]   to schedule your mammogram.  [facility]. [address] [place], [place] [address]. [place] .    Goal for fasting blood sugar ranges from 80 to 120 and 2 hours after any meal or at bedtime should be between 130 to 170.   Please consider getting Shingrix and pneumococcal  vaccine at local pharmacy.    1. Controlled type 2 diabetes mellitus without complication, without long-term current use of insulin (HCC) (Primary)  - POCT glycosylated hemoglobin (Hb A1C) - Microalbumin / creatinine urine ratio - Semaglutide ,0.25 or 0.5MG /DOS, 2 MG/3ML SOPN; Inject 0.5 mg into the skin once a week.  Dispense: 3 mL; Refill: 3  2. Class 3 severe obesity with serious comorbidity and body mass index (BMI) of 50.0 to 59.9 in adult  - Semaglutide ,0.25 or 0.5MG /DOS, 2 MG/3ML SOPN; Inject 0.5 mg into the skin once a week.  Dispense: 3 mL; Refill: 3   It is important that you exercise regularly at least 30 minutes 5 times a week as tolerated  Think about what you will eat, plan ahead. Choose " clean, green, fresh or frozen" over canned, processed or packaged foods which are more sugary, salty and fatty. 70 to 75% of food eaten should be vegetables and fruit. Three meals at set times with snacks allowed between meals, but they must be fruit or vegetables. Aim to eat over a 12 hour period , example 7 am to 7 pm, and STOP after  your last meal of the day. Drink water,generally about 64 ounces per day, no other drink is as healthy. Fruit juice is best enjoyed in a healthy way, by EATING the fruit.  Thanks for choosing [facility] we consider it a privelige to serve you.

## 2023-10-25 NOTE — Assessment & Plan Note (Addendum)
 Wt Readings from Last 3 Encounters:  10/25/23 (!) 302 lb 6.4 oz (137.2 kg)  10/27/22 (!) 330 lb 6.4 oz (149.9 kg)  05/05/21 (!) 307 lb 0.2 oz (139.3 kg)   Body mass index is 53.57 kg/m.  She has lost 28 pounds in a year, patient currently send on efforts at losing weight Patient counseled on low-carb diet Encouraged to engage in regular moderate exercises at least 150 minutes weekly as tolerated Increasing Ozempic  to 0.5 mg once weekly

## 2023-10-25 NOTE — Assessment & Plan Note (Signed)
 Lab Results  Component Value Date   HGBA1C 6.1 (A) 10/25/2023  Chronic medical condition currently well-controlled For better weight loss benefit will increase Ozempic  to 0.5 mg once weekly Continue low-carb diet and engage in regular moderate exercise at least [place] 50 minutes weekly as tolerated CBG goals discussed patient encouraged to report blood sugar readings less than 70 Up-to-date with diabetic eye exam records requested

## 2023-10-25 NOTE — Assessment & Plan Note (Addendum)
Patient educated on CDC recommendation for the shingles vaccine. Verbal consent was obtained from the patient, vaccine administered by nurse, no sign of adverse reactions noted at this time. Patient education on arm soreness and use of tylenol or ibuprofen  for this patient  was discussed. Patient educated on the signs and symptoms of adverse effect and advise to contact the office if they occur.  

## 2023-10-25 NOTE — Progress Notes (Signed)
 Established Patient Office Visit  Subjective:  Patient ID: Jessica Schaefer, female    DOB: [birth year]  Age: 53 y.o. MRN: [id number]  CC:  Chief Complaint  Patient presents with   Medical Management of Chronic Issues    HPI Jessica Schaefer is a 53 y.o. female  has a past medical history of Anemia (1995), Gout, Hyperlipidemia, Hypertension, Prediabetes, and Vitamin D  deficiency (03/2020).  Patient presents for follow-up for her chronic medical conditions  Hypertension.  Currently on hydrochlorothiazide  25 mg daily, lisinopril  20 mg daily.  Patient denies chest pain shortness of breath edema  Type 2 diabetes.  Currently on Ozempic  0.25 mg once weekly, she has been trying to follow a low-carb diet but does not exercise.  Patient denies polyuria polyphagia polydipsia  She was encouraged to get her mammogram completed, due for pneumococcal vaccine and shingles vaccine patient encouraged to get the vaccines.  Patient referred for cervical cancer screening.  Up-to-date with diabetic eye exam    Past Medical History:  Diagnosis Date   Anemia 1995   Gout    Hyperlipidemia    Hypertension    Prediabetes    Vitamin D  deficiency 03/2020    Past Surgical History:  Procedure Laterality Date   BREAST SURGERY     REDUCTION; 1997-1998   CHOLECYSTECTOMY N/A 07/03/2020   Procedure: LAPAROSCOPIC CHOLECYSTECTOMY;  Surgeon: Oza Blumenthal, MD;  Location: [facility];  Service: General;  Laterality: N/A;    Family History  Problem Relation Age of Onset   Early death Mother    Hypertension Mother    Obesity Mother     Social History   Socioeconomic History   Marital status: Single    Spouse name: Not on file   Number of children: 2   Years of education: Not on file   Highest education level: 12th grade  Occupational History   Not on file  Tobacco Use   Smoking status: Never   Smokeless tobacco: Never  Vaping Use   Vaping status: Not on file  Substance and Sexual Activity    Alcohol use: No   Drug use: No   Sexual activity: Not Currently  Other Topics Concern   Not on file  Social History Narrative   Lives with her daughter    Social Drivers of Health   Financial Resource Strain: Not on file  Food Insecurity: No Food Insecurity (01/07/2023)   Hunger Vital Sign    Worried About Running Out of Food in the Last Year: Never true    Ran Out of Food in the Last Year: Never true  Transportation Needs: No Transportation Needs (01/07/2023)   PRAPARE - Administrator, Civil Service (Medical): No    Lack of Transportation (Non-Medical): No  Physical Activity: Insufficiently Active (01/07/2023)   Exercise Vital Sign    Days of Exercise per Week: 1 day    Minutes of Exercise per Session: 10 min  Stress: Not on file  Social Connections: Moderately Isolated (01/07/2023)   Social Connection and Isolation Panel [NHANES]    Frequency of Communication with Friends and Family: Twice a week    Frequency of Social Gatherings with Friends and Family: Once a week    Attends Religious Services: 1 to 4 times per year    Active Member of [employer] or Organizations: No    Attends Engineer, structural: Not on file    Marital Status: Never married  Intimate Partner Violence: Not on file  Outpatient Medications Prior to Visit  Medication Sig Dispense Refill   allopurinol  (ZYLOPRIM ) 100 MG tablet TAKE 1 TABLET(100 MG) BY MOUTH DAILY 90 tablet 3   atorvastatin  (LIPITOR) 10 MG tablet TAKE 1 TABLET(10 MG) BY MOUTH DAILY 90 tablet 1   hydrochlorothiazide  (HYDRODIURIL ) 25 MG tablet TAKE 1 TABLET(25 MG) BY MOUTH DAILY 90 tablet 1   lisinopril  (ZESTRIL ) 20 MG tablet TAKE 1 TABLET(20 MG) BY MOUTH DAILY 90 tablet 1   OZEMPIC , 0.25 OR 0.5 MG/DOSE, 2 MG/3ML SOPN INJECT 0.25 MG INTO THE SKIN ONCE A WEEK 3 mL 0   Blood Glucose Monitoring Suppl DEVI 1 each by Does not apply route in the morning, at noon, and at bedtime. May substitute to any manufacturer covered by patient's  insurance. (Patient not taking: Reported on 10/25/2023) 1 each 0   Glucose Blood (BLOOD GLUCOSE TEST STRIPS) STRP May substitute to any manufacturer covered by patient's insurance. (Patient not taking: Reported on 10/25/2023) 100 strip 0   No facility-administered medications prior to visit.    No Known Allergies  ROS Review of Systems  Constitutional:  Negative for appetite change, chills, fatigue and fever.  HENT:  Negative for congestion, postnasal drip, rhinorrhea and sneezing.   Respiratory:  Negative for cough, shortness of breath and wheezing.   Cardiovascular:  Negative for chest pain, palpitations and leg swelling.  Gastrointestinal:  Negative for abdominal pain, constipation, nausea and vomiting.  Genitourinary:  Negative for difficulty urinating, dysuria, flank pain and frequency.  Musculoskeletal:  Negative for arthralgias, back pain, joint swelling and myalgias.  Skin:  Negative for color change, pallor, rash and wound.  Neurological:  Negative for dizziness, facial asymmetry, weakness, numbness and headaches.  Psychiatric/Behavioral:  Negative for behavioral problems, confusion, self-injury and suicidal ideas.       Objective:    Physical Exam Vitals and nursing note reviewed.  Constitutional:      General: She is not in acute distress.    Appearance: Normal appearance. She is obese. She is not ill-appearing, toxic-appearing or diaphoretic.  Eyes:     General: No scleral icterus.       Right eye: No discharge.        Left eye: No discharge.     Extraocular Movements: Extraocular movements intact.     Conjunctiva/sclera: Conjunctivae normal.  Cardiovascular:     Rate and Rhythm: Normal rate and regular rhythm.     Pulses: Normal pulses.     Heart sounds: Normal heart sounds. No murmur heard.    No friction rub. No gallop.  Pulmonary:     Effort: Pulmonary effort is normal. No respiratory distress.     Breath sounds: Normal breath sounds. No stridor. No wheezing,  rhonchi or rales.  Chest:     Chest wall: No tenderness.  Abdominal:     General: There is no distension.     Palpations: Abdomen is soft.     Tenderness: There is no abdominal tenderness. There is no right CVA tenderness, left CVA tenderness or guarding.  Musculoskeletal:        General: No swelling, tenderness, deformity or signs of injury.     Right lower leg: No edema.     Left lower leg: No edema.  Skin:    General: Skin is warm and dry.     Capillary Refill: Capillary refill takes less than 2 seconds.     Coloration: Skin is not jaundiced or pale.     Findings: No bruising, erythema or lesion.  Neurological:     Mental Status: She is alert and oriented to person, place, and time.     Motor: No weakness.     Coordination: Coordination normal.     Gait: Gait normal.  Psychiatric:        Mood and Affect: Mood normal.        Behavior: Behavior normal.        Thought Content: Thought content normal.        Judgment: Judgment normal.     BP 103/75   Pulse (!) 104   Temp (!) 97 F (36.1 C)   Wt (!) 302 lb 6.4 oz (137.2 kg)   SpO2 100%   BMI 53.57 kg/m  Wt Readings from Last 3 Encounters:  10/25/23 (!) 302 lb 6.4 oz (137.2 kg)  10/27/22 (!) 330 lb 6.4 oz (149.9 kg)  05/05/21 (!) 307 lb 0.2 oz (139.3 kg)    Lab Results  Component Value Date   TSH 1.030 10/27/2022   Lab Results  Component Value Date   WBC 13.2 (H) 10/27/2022   HGB 14.3 10/27/2022   HCT 44.0 10/27/2022   MCV 89 10/27/2022   PLT 335 10/27/2022   Lab Results  Component Value Date   NA 140 10/27/2022   K 3.7 10/27/2022   CO2 22 10/27/2022   GLUCOSE 107 (H) 10/27/2022   BUN 13 10/27/2022   CREATININE 0.96 10/27/2022   BILITOT 0.3 10/27/2022   ALKPHOS 139 (H) 10/27/2022   AST 18 10/27/2022   ALT 20 10/27/2022   PROT 7.5 10/27/2022   ALBUMIN 4.0 10/27/2022   CALCIUM  9.4 10/27/2022   ANIONGAP 11 07/03/2020   EGFR 71 10/27/2022   Lab Results  Component Value Date   CHOL 186 10/27/2022    Lab Results  Component Value Date   HDL 50 10/27/2022   Lab Results  Component Value Date   LDLCALC 114 (H) 10/27/2022   Lab Results  Component Value Date   TRIG 121 10/27/2022   Lab Results  Component Value Date   CHOLHDL 3.7 10/27/2022   Lab Results  Component Value Date   HGBA1C 6.1 (A) 10/25/2023      Assessment & Plan:   Problem List Items Addressed This Visit       Cardiovascular and Mediastinum   High blood pressure   Well-controlled on hydrochlorothiazide  25 mg daily, lisinopril  20 mg daily Continue current medications DASH diet and commitment to daily physical activity for a minimum of 30 minutes discussed and encouraged, as a part of hypertension management. The importance of attaining a healthy weight is also discussed.     10/25/2023    2:34 PM 10/27/2022   11:26 AM 05/05/2021   10:30 AM 07/04/2020    6:28 AM 07/04/2020    2:01 AM 07/03/2020    9:38 PM 07/03/2020    7:53 PM  BP/Weight  Systolic BP 103 138 151 127 128 146 153  Diastolic BP 75 85 106 91 84 90 103  Wt. (Lbs) 302.4 330.4 307.01      BMI 53.57 kg/m2 58.53 kg/m2 54.38 kg/m2               Relevant Medications   lisinopril  (ZESTRIL ) 20 MG tablet   hydrochlorothiazide  (HYDRODIURIL ) 25 MG tablet   Other Relevant Orders   CMP14+EGFR     Endocrine   Controlled type 2 diabetes mellitus (HCC) - Primary   Lab Results  Component Value Date   HGBA1C 6.1 (A) 10/25/2023  Chronic medical condition currently well-controlled For better weight loss benefit will increase Ozempic  to 0.5 mg once weekly Continue low-carb diet and engage in regular moderate exercise at least [place] 50 minutes weekly as tolerated CBG goals discussed patient encouraged to report blood sugar readings less than 70 Up-to-date with diabetic eye exam records requested      Relevant Medications   Semaglutide ,0.25 or 0.5MG /DOS, 2 MG/3ML SOPN   lisinopril  (ZESTRIL ) 20 MG tablet   Other Relevant Orders   POCT glycosylated  hemoglobin (Hb A1C) (Completed)   Microalbumin / creatinine urine ratio     Other   Need for shingles vaccine   Patient educated on CDC recommendation for the shingles vaccine. Verbal consent was obtained from the patient, vaccine administered by nurse, no sign of adverse reactions noted at this time. Patient education on arm soreness and use of tylenol  or ibuprofen  for this patient  was discussed. Patient educated on the signs and symptoms of adverse effect and advise to contact the office if they occur.       Relevant Orders   Varicella-zoster vaccine IM (Completed)   Class 3 severe obesity with serious comorbidity and body mass index (BMI) of 50.0 to 59.9 in adult   Wt Readings from Last 3 Encounters:  10/25/23 (!) 302 lb 6.4 oz (137.2 kg)  10/27/22 (!) 330 lb 6.4 oz (149.9 kg)  05/05/21 (!) 307 lb 0.2 oz (139.3 kg)   Body mass index is 53.57 kg/m.  She has lost 28 pounds in a year, patient currently send on efforts at losing weight Patient counseled on low-carb diet Encouraged to engage in regular moderate exercises at least 150 minutes weekly as tolerated Increasing Ozempic  to 0.5 mg once weekly      Relevant Medications   Semaglutide ,0.25 or 0.5MG /DOS, 2 MG/3ML SOPN   Dyslipidemia, goal LDL below 70   Lab Results  Component Value Date   CHOL 186 10/27/2022   HDL 50 10/27/2022   LDLCALC 114 (H) 10/27/2022   TRIG 121 10/27/2022   CHOLHDL 3.7 10/27/2022  Currently on atorvastatin  10 mg daily Checking direct LDL LDL goal is less than 70      Relevant Medications   lisinopril  (ZESTRIL ) 20 MG tablet   hydrochlorothiazide  (HYDRODIURIL ) 25 MG tablet   Other Relevant Orders   Direct LDL   Other Visit Diagnoses       Screening for cervical cancer       Relevant Orders   Ambulatory referral to Gynecology       Meds ordered this encounter  Medications   Semaglutide ,0.25 or 0.5MG /DOS, 2 MG/3ML SOPN    Sig: Inject 0.5 mg into the skin once a week.    Dispense:  3 mL     Refill:  3   lisinopril  (ZESTRIL ) 20 MG tablet    Sig: Take 1 tablet (20 mg total) by mouth daily.    Dispense:  90 tablet    Refill:  1    ZERO refills remain on this prescription. Your patient is requesting advance approval of refills for this medication to PREVENT ANY MISSED DOSES   hydrochlorothiazide  (HYDRODIURIL ) 25 MG tablet    Sig: Take 1 tablet (25 mg total) by mouth daily.    Dispense:  90 tablet    Refill:  1    ZERO refills remain on this prescription. Your patient is requesting advance approval of refills for this medication to PREVENT ANY MISSED DOSES    Follow-up: Return in about 9 weeks (  around 12/27/2023).    Lindsy Cerullo R Erice Ahles, FNP

## 2023-10-26 ENCOUNTER — Telehealth: Payer: Self-pay | Admitting: Nurse Practitioner

## 2023-10-26 ENCOUNTER — Other Ambulatory Visit: Payer: Self-pay | Admitting: Nurse Practitioner

## 2023-10-26 DIAGNOSIS — E785 Hyperlipidemia, unspecified: Secondary | ICD-10-CM

## 2023-10-26 LAB — CMP14+EGFR
ALT: 19 IU/L (ref 0–32)
AST: 17 IU/L (ref 0–40)
Albumin: 3.8 g/dL (ref 3.8–4.9)
Alkaline Phosphatase: 142 IU/L — ABNORMAL HIGH (ref 44–121)
BUN/Creatinine Ratio: 14 (ref 9–23)
BUN: 16 mg/dL (ref 6–24)
Bilirubin Total: 0.3 mg/dL (ref 0.0–1.2)
CO2: 24 mmol/L (ref 20–29)
Calcium: 9.9 mg/dL (ref 8.7–10.2)
Chloride: 102 mmol/L (ref 96–106)
Creatinine, Ser: 1.11 mg/dL — ABNORMAL HIGH (ref 0.57–1.00)
Globulin, Total: 3.5 g/dL (ref 1.5–4.5)
Glucose: 80 mg/dL (ref 70–99)
Potassium: 4 mmol/L (ref 3.5–5.2)
Sodium: 142 mmol/L (ref 134–144)
Total Protein: 7.3 g/dL (ref 6.0–8.5)
eGFR: 59 mL/min/{1.73_m2} — ABNORMAL LOW (ref >59)

## 2023-10-26 LAB — MICROALBUMIN / CREATININE URINE RATIO
Creatinine, Urine: 139.9 mg/dL
Microalb/Creat Ratio: 8 mg/g{creat} (ref 0–29)
Microalbumin, Urine: 10.6 ug/mL

## 2023-10-26 LAB — LDL CHOLESTEROL, DIRECT: LDL Direct: 80 mg/dL (ref 0–99)

## 2023-10-26 MED ORDER — ATORVASTATIN CALCIUM 20 MG PO TABS: 20.0000 mg | ORAL_TABLET | Freq: Every day | ORAL | 1 refills | Status: DC

## 2023-10-26 NOTE — Telephone Encounter (Signed)
 Reason for CRM: Patient has returned missed phone call from C. Becton, CMA

## 2023-11-25 ENCOUNTER — Other Ambulatory Visit: Payer: Self-pay | Admitting: Nurse Practitioner

## 2023-11-25 DIAGNOSIS — E119 Type 2 diabetes mellitus without complications: Secondary | ICD-10-CM

## 2023-11-25 DIAGNOSIS — Z6841 Body Mass Index (BMI) 40.0 and over, adult: Secondary | ICD-10-CM

## 2023-12-05 ENCOUNTER — Encounter: Payer: Self-pay | Admitting: Nurse Practitioner

## 2023-12-21 ENCOUNTER — Telehealth: Payer: Self-pay | Admitting: Nurse Practitioner

## 2023-12-26 ENCOUNTER — Telehealth: Payer: Self-pay

## 2023-12-26 NOTE — Telephone Encounter (Signed)
 Pharmacy Patient Advocate Encounter   Received notification from CoverMyMeds that prior authorization for OZEMPIC  is required/requested.   Insurance verification completed.   The patient is insured through [facility] .   Per test claim: PA required; PA submitted to above mentioned insurance via CoverMyMeds Key/confirmation #/EOC [id number] Status is pending

## 2023-12-26 NOTE — Telephone Encounter (Signed)
 Pharmacy Patient Advocate Encounter  Received notification from [employer] that Prior Authorization for OZEMPIC  has been APPROVED from 12/26/2023 to 12/25/2024   PA #/Case ID/Reference #: [id number]

## 2024-01-03 ENCOUNTER — Ambulatory Visit: Payer: Self-pay | Admitting: Nurse Practitioner

## 2024-01-09 NOTE — Telephone Encounter (Signed)
 Call patient to reschedule appt. Leave a voice message

## 2024-01-25 ENCOUNTER — Other Ambulatory Visit: Payer: Self-pay | Admitting: Nurse Practitioner

## 2024-01-25 DIAGNOSIS — E785 Hyperlipidemia, unspecified: Secondary | ICD-10-CM

## 2024-04-09 NOTE — Telephone Encounter (Signed)
 Error

## 2024-04-20 ENCOUNTER — Other Ambulatory Visit: Payer: Self-pay | Admitting: Nurse Practitioner

## 2024-04-20 DIAGNOSIS — I10 Essential (primary) hypertension: Secondary | ICD-10-CM

## 2024-04-20 NOTE — Telephone Encounter (Signed)
 Pt will need a follow up appt. Thanks [facility]

## 2024-04-23 ENCOUNTER — Telehealth: Payer: Self-pay

## 2024-04-23 NOTE — Telephone Encounter (Signed)
 Called [facility] to schedule appt in order to receive a refill on the medication.

## 2024-06-24 ENCOUNTER — Other Ambulatory Visit: Payer: Self-pay | Admitting: Nurse Practitioner

## 2024-06-24 DIAGNOSIS — E119 Type 2 diabetes mellitus without complications: Secondary | ICD-10-CM

## 2024-06-24 DIAGNOSIS — E66813 Obesity, class 3: Secondary | ICD-10-CM

## 2024-06-25 NOTE — Telephone Encounter (Signed)
 Please advise [facility]

## 2024-06-25 NOTE — Telephone Encounter (Signed)
 OZEMPIC , 0.25 OR 0.5 MG/DOSE, 2 MG/3ML SOPN [Pharmacy Med Name: OZEMPIC  0.25 OR 0.5MG  DOS(2MG /3ML)]
